# Patient Record
Sex: Female | Born: 1990 | Race: Black or African American | Hispanic: No | Marital: Married | Smoking: Former smoker
Health system: Southern US, Community
[De-identification: ages and names within clinical notes are randomized; demographics above are authoritative.]

## PROBLEM LIST (undated history)

## (undated) DIAGNOSIS — T7840XA Allergy, unspecified, initial encounter: Secondary | ICD-10-CM

## (undated) DIAGNOSIS — J45909 Unspecified asthma, uncomplicated: Secondary | ICD-10-CM

## (undated) DIAGNOSIS — J302 Other seasonal allergic rhinitis: Secondary | ICD-10-CM

## (undated) DIAGNOSIS — Z973 Presence of spectacles and contact lenses: Secondary | ICD-10-CM

## (undated) HISTORY — DX: Allergy, unspecified, initial encounter: T78.40XA

## (undated) HISTORY — DX: Unspecified asthma, uncomplicated: J45.909

## (undated) HISTORY — PX: ABDOMINAL HYSTERECTOMY: SHX81

---
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
## 2012-07-14 ENCOUNTER — Encounter: Payer: Self-pay | Admitting: Family

## 2012-07-14 ENCOUNTER — Ambulatory Visit (INDEPENDENT_AMBULATORY_CARE_PROVIDER_SITE_OTHER): Payer: Managed Care, Other (non HMO) | Admitting: Family

## 2012-07-14 VITALS — BP 116/78 | HR 114 | Ht 66.0 in | Wt 175.0 lb

## 2012-07-14 DIAGNOSIS — Z111 Encounter for screening for respiratory tuberculosis: Secondary | ICD-10-CM

## 2012-07-14 DIAGNOSIS — E559 Vitamin D deficiency, unspecified: Secondary | ICD-10-CM

## 2012-07-14 NOTE — Addendum Note (Signed)
Addended by: Beverely Low on: 07/14/2012 04:20 PM   Modules accepted: Orders

## 2012-07-14 NOTE — Patient Instructions (Addendum)
Vitamin D Deficiency   Not having enough vitamin D is called a deficiency. Your body needs this vitamin to keep your bones strong and healthy. Having too little of it can make your bones soft or can cause other health problems.   HOME CARE   Take all vitamins, herbs, or nutrition drinks (supplements) as told by your doctor.   Have your blood tested 2 months after taking vitamins, herbs, or nutrition drinks.   Eat foods that have vitamin D. This includes:   Dairy products, cereals, or juices with added vitamin D. Check the label.   Fatty fish like salmon or trout.   Eggs.   Oysters.   Go outside for 10 to 15 minutes when the sun is shining. Do this 3 times a week. Do not do this if you have skin cancer.   Do not use tanning beds.   Stay at a healthy weight. Lose weight if needed.   Keep all doctor visits as told.  GET HELP IF:   You have questions.   You continue to have problems.   You feel sick to your stomach (nauseous) or throw up (vomit).   You cannot go poop (constipated).   You feel confused.   You have severe belly (abdominal) or back pain.  MAKE SURE YOU:   Understand these instructions.   Will watch your condition.   Will get help right away if you are not doing well or get worse.  Document Released: 12/12/2010 Document Revised: 03/17/2011 Document Reviewed: 12/12/2010  ExitCare Patient Information 2014 [employer].

## 2012-07-14 NOTE — Progress Notes (Signed)
  Subjective:    Patient ID: Desiree Martinez, female    DOB: 1990-10-01, 22 y.o.   MRN: [id number]  HPI  22 year old [place] American female, nonsmoker, new take patient to the practice is in to be established. Denies any concerns. Reports a history of vitamin D deficiency in the past. But reports her vitamin D has been stable recently. She will be attending [employer] this fall, obtaining a Masters degree in accounting this fall. Therefore, she is requiring a PPD for school.  Review of Systems  Constitutional: Negative.   Respiratory: Negative.   Cardiovascular: Negative.   Gastrointestinal: Negative.   Allergic/Immunologic: Negative.   Neurological: Negative.   Hematological: Negative.   Psychiatric/Behavioral: Negative.    Past Medical History  Diagnosis Date  . Asthma     History   Social History  . Marital Status: Single    Spouse Name: N/A    Number of Children: N/A  . Years of Education: N/A   Occupational History  . Not on file.   Social History Main Topics  . Smoking status: Current Every Day Smoker    Types: Cigars  . Smokeless tobacco: Not on file  . Alcohol Use: Yes  . Drug Use: Yes    Special: Marijuana  . Sexually Active: Not on file   Other Topics Concern  . Not on file   Social History Narrative  . No narrative on file    History reviewed. No pertinent past surgical history.  No family history on file.  No Known Allergies  No current outpatient prescriptions on file prior to visit.   No current facility-administered medications on file prior to visit.    BP 116/78  Pulse 114  Ht 5\' 6"  (1.676 m)  Wt 175 lb (79.379 kg)  BMI 28.26 kg/m2  SpO2 98%  LMP 06/09/2014chart    Objective:   Physical Exam  Constitutional: She is oriented to person, place, and time. She appears well-developed and well-nourished.  Neck: Normal range of motion. Neck supple. No thyromegaly present.  Cardiovascular: Normal rate, regular rhythm and normal  heart sounds.   Pulmonary/Chest: Effort normal.  Abdominal: Soft.  Musculoskeletal: Normal range of motion.  Neurological: She is alert and oriented to person, place, and time.  Skin: Skin is dry.  Psychiatric: She has a normal mood and affect.          Assessment & Plan:  Assessment:  1. Vitamin D deficiency 2. Need for tuberculin skin test  Plan: Vitamin D sent. Return for complete physical exam. Return in 48-72 hours have PPD read. Call with any questions or concerns.

## 2012-07-19 LAB — VITAMIN D 1,25 DIHYDROXY
Vitamin D2 1, 25 (OH)2: 11 pg/mL
Vitamin D3 1, 25 (OH)2: 71 pg/mL

## 2013-01-21 ENCOUNTER — Ambulatory Visit (INDEPENDENT_AMBULATORY_CARE_PROVIDER_SITE_OTHER): Payer: Managed Care, Other (non HMO)

## 2013-01-21 VITALS — BP 125/81 | HR 99 | Resp 15 | Ht 65.0 in | Wt 189.0 lb

## 2013-01-21 DIAGNOSIS — M79609 Pain in unspecified limb: Secondary | ICD-10-CM

## 2013-01-21 DIAGNOSIS — M2010 Hallux valgus (acquired), unspecified foot: Secondary | ICD-10-CM

## 2013-01-21 DIAGNOSIS — M7750 Other enthesopathy of unspecified foot: Secondary | ICD-10-CM

## 2013-01-21 DIAGNOSIS — M7751 Other enthesopathy of right foot: Secondary | ICD-10-CM

## 2013-01-21 NOTE — Patient Instructions (Signed)
Pre-Operative Instructions  Congratulations, you have decided to take an important step to improving your quality of life.  You can be assured that the doctors of [facility] will be with you every step of the way.  1. Plan to be at the surgery center/hospital at least 1 (one) hour prior to your scheduled time unless otherwise directed by the surgical center/hospital staff.  You must have a responsible adult accompany you, remain during the surgery and drive you home.  Make sure you have directions to the surgical center/hospital and know how to get there on time. 2. For hospital based surgery you will need to obtain a history and physical form from your family physician within 1 month prior to the date of surgery- we will give you a form for you primary physician.  3. We make every effort to accommodate the date you request for surgery.  There are however, times where surgery dates or times have to be moved.  We will contact you as soon as possible if a change in schedule is required.   4. No Aspirin/Ibuprofen for one week before surgery.  If you are on aspirin, any non-steroidal anti-inflammatory medications (Mobic, Aleve, Ibuprofen) you should stop taking it 7 days prior to your surgery.  You make take Tylenol  For pain prior to surgery.  5. Medications- If you are taking daily heart and blood pressure medications, seizure, reflux, allergy, asthma, anxiety, pain or diabetes medications, make sure the surgery center/hospital is aware before the day of surgery so they may notify you which medications to take or avoid the day of surgery. 6. No food or drink after midnight the night before surgery unless directed otherwise by surgical center/hospital staff. 7. No alcoholic beverages 24 hours prior to surgery.  No smoking 24 hours prior to or 24 hours after surgery. 8. Wear loose pants or shorts- loose enough to fit over bandages, boots, and casts. 9. No slip on shoes, sneakers are best. 10. Bring  your boot with you to the surgery center/hospital.  Also bring crutches or a walker if your physician has prescribed it for you.  If you do not have this equipment, it will be provided for you after surgery. 11. If you have not been contracted by the surgery center/hospital by the day before your surgery, call to confirm the date and time of your surgery. 12. Leave-time from work may vary depending on the type of surgery you have.  Appropriate arrangements should be made prior to surgery with your employer. 13. Prescriptions will be provided immediately following surgery by your doctor.  Have these filled as soon as possible after surgery and take the medication as directed. 14. Remove nail polish on the operative foot. 15. Wash the night before surgery.  The night before surgery wash the foot and leg well with the antibacterial soap provided and water paying special attention to beneath the toenails and in between the toes.  Rinse thoroughly with water and dry well with a towel.  Perform this wash unless told not to do so by your physician.  Enclosed: 1 Ice pack (please put in freezer the night before surgery)   1 Hibiclens skin cleaner-can substitute any antibacterial liquid soap   Pre-op Instructions  If you have any questions regarding the instructions, do not hesitate to call our office.  [place]: [place], [place] [address] [place]: [address] [place], [place] [address] [phone]  [place]: [place], [place]  [address] [phone]  Dr. Kendell Bane DPM, Dr. Ila Mcgill DPM Dr. Harriet Masson DPM, Dr. Lanelle Bal DPM, Dr. Trudie Buckler DPM

## 2013-01-21 NOTE — Progress Notes (Signed)
   Subjective:    Patient ID: Desiree Martinez, female    DOB: 08/02/1990, 23 y.o.   MRN: [id number]  HPI N bunion        L R 1st MPJ        D 4 years        O off and on        C 1st toe abducts, sharp and can be a pulsating pain        A exercise, long period of walking, certain shoes        T none    Review of Systems  Allergic/Immunologic: Positive for food allergies.       Peanuts  All other systems reviewed and are negative.       Objective:   Physical Exam Vascular status is intact with pedal pulses palpable DP and PT +2/4 bilateral Refill timed 3-4 seconds all digits. Skin temperature warm turgor normal no edema rubor pallor or varicosities noted. Neurologically epicritic and proprioceptive sensations appear to be intact and symmetric bilateral there is normal plantar response DTRs not listed. Dermatologically skin color pigment normal hair growth absent. Nails normal trophic. No open wounds or ulcerations noted. Orthopedic biomechanical exam there is notable flexible digital contractures 2 through 5 otherwise rectus the hallux is laterally deviated bilateral more so on the right than left starting to push up and over on the second digit on both feet as well. X-rays demonstrate elevated I am angle approximately 13-14 on the right foot hallux abductus angle 30 with sesamoid position 6. There is some mild asymmetric joint space narrowing first MTP joint otherwise good clinical range of motion is noted the articular surface otherwise appears to be adequate. There is no crepitus on range of motion most of her pain associated with bony prominence and difficulty with certain: Shoes. 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 Would  like to schedule surgery at this time for June for proposed bunion correction. At this time consent form for [facility] with pin or screw fixation is reviewed and signed by the patient. We discussed the preop. Operative and postoperative course including risks palpitations and alternatives. Patient is dispensed literature about bunions surgery with additional details. Contact us if she has any additional questions in the interim.       Assessment & Plan:  Assessment this time hallux abductovalgus deformity bilateral feet right more so than left right being symptomatic and painful. Plan at this time did review consent form for [facility] and plan for potential surgical correction for great toe joint deformity right foot with pin or screw fixation in June of this year. Surgery done at decreased especially surgical center under IV sedation and local or regional anesthetic block. Patient will contact us is any additional questions in the interim. Surgery scheduled for June    Harriet Masson DPM

## 2013-06-06 DIAGNOSIS — M2010 Hallux valgus (acquired), unspecified foot: Secondary | ICD-10-CM

## 2013-06-06 HISTORY — PX: OTHER SURGICAL HISTORY: SHX169

## 2013-06-08 ENCOUNTER — Telehealth: Payer: Self-pay

## 2013-06-08 NOTE — Telephone Encounter (Signed)
Spoke with pt regarding post op status. She is managing her pain well, taking Rx pain medication scheduled. Advised to ice and elevate and remain in boot especially when ambulatory.

## 2013-06-14 ENCOUNTER — Ambulatory Visit (INDEPENDENT_AMBULATORY_CARE_PROVIDER_SITE_OTHER): Payer: Managed Care, Other (non HMO)

## 2013-06-14 VITALS — BP 113/72 | HR 76 | Resp 15 | Ht 65.5 in | Wt 188.0 lb

## 2013-06-14 DIAGNOSIS — M7750 Other enthesopathy of unspecified foot: Secondary | ICD-10-CM

## 2013-06-14 DIAGNOSIS — M21619 Bunion of unspecified foot: Secondary | ICD-10-CM

## 2013-06-14 DIAGNOSIS — M2010 Hallux valgus (acquired), unspecified foot: Secondary | ICD-10-CM

## 2013-06-14 DIAGNOSIS — Z09 Encounter for follow-up examination after completed treatment for conditions other than malignant neoplasm: Secondary | ICD-10-CM

## 2013-06-14 DIAGNOSIS — M7751 Other enthesopathy of right foot: Secondary | ICD-10-CM

## 2013-06-14 NOTE — Patient Instructions (Signed)

## 2013-06-14 NOTE — Progress Notes (Signed)
   Subjective:    Patient ID: Desiree Martinez, female    DOB: 1990-01-18, 23 y.o.   MRN: [id number]  HPI Comments: Pt states she feels she'd doing well.     Review of Systems no new findings or systemic changes noted     Objective:   Physical Exam Neurovascular status is intact pedal pulses are palpable epicritic and proprioceptive sensations intact dressings intact and dry no dehiscence no discharge good range of motion the hallux with proxy 4550 dorsiflexion 500 plantar flexion minimal or no pain. At this time presto compressive dressing was reapplied starting Sunday remove dressing resume normal bathing and hygiene and will continue with active passive shoe toe exercises maintain air fracture boot for 4 more weeks and ankle is dispensed to maintain compression during the day we've off at night wash and reapply every day. Patient will reappointed in 4 weeks for further followup and x-rays at that time to       Assessment & Plan:  Assessment postop good postop progress following Austin bunionectomy patient's alignment looks good first MTP area right instructions are given for place also range of motion exercises demonstrated the patient and maintain boot and fracture walker at all times may remove for exercises moving during the day and normal bathing and hygiene starting Sunday apply lotion to the incision area cocoa butter is recommended for the scar or middermal is also recommended followup in 4 weeks  Harriet Masson DPM

## 2013-07-15 ENCOUNTER — Ambulatory Visit (INDEPENDENT_AMBULATORY_CARE_PROVIDER_SITE_OTHER): Payer: Managed Care, Other (non HMO)

## 2013-07-15 VITALS — BP 107/71 | HR 86 | Resp 18

## 2013-07-15 DIAGNOSIS — M2010 Hallux valgus (acquired), unspecified foot: Secondary | ICD-10-CM

## 2013-07-15 DIAGNOSIS — M2011 Hallux valgus (acquired), right foot: Secondary | ICD-10-CM

## 2013-07-15 DIAGNOSIS — Z09 Encounter for follow-up examination after completed treatment for conditions other than malignant neoplasm: Secondary | ICD-10-CM

## 2013-07-15 NOTE — Progress Notes (Signed)
   Subjective:    Patient ID: Virgel Manifold, female    DOB: 07/23/1990, 23 y.o.   MRN: [id number]  HPI my right foot is doing good and surgery was 06/06/13    Review of Systems No new findings or systemic changes the    Objective:   Physical Exam Patient this time is approximately 6 weeks status post Austin bunionectomy right foot excellent dorsiflexion approximately 15 plantar flexion down to rectus our advised to continue with range of motion exercises as instructed specialty plantar flexion. Neurovascular status is intact pedal pulses palpable patient is advised to maintain a stable athletic or walking shoe crocs or Birkenstock sandals or twice daily is around the house. No barefoot shoes no flimsy shoes or flip-flops       Assessment & Plan:   Assessment good postop progress good consolidation the osteotomy intact fixation with no displacement hallux is relatively rectus position with good alignment no pain or discomfort minimal edema still present recheck in 2 months for long-term followup with x-ray contact if any changes occur stressed the importance of daily stretch and range of motion exercises to be continued  Harriet Masson DPM

## 2013-07-15 NOTE — Patient Instructions (Signed)
ICE INSTRUCTIONS  Apply ice or cold pack to the affected area at least 3 times a day for 10-15 minutes each time.  You should also use ice after prolonged activity or vigorous exercise.  Do not apply ice longer than 20 minutes at one time.  Always keep a cloth between your skin and the ice pack to prevent burns.  Being consistent and following these instructions will help control your symptoms.  We suggest you purchase a gel ice pack because they are reusable and do bit leak.  Some of them are designed to wrap around the area.  Use the method that works best for you.  Here are some other suggestions for icing.   Use a frozen bag of peas or corn-inexpensive and molds well to your body, usually stays frozen for 10 to 20 minutes.  Wet a towel with cold water and squeeze out the excess until it's damp.  Place in a bag in the freezer for 20 minutes. Then remove and use.  Also continue to do range of motion exercising moving the great toe up and down. Do Lisa 100 toe pushup simple times a day.  Discontinue boot and resume using a couple walking tennis or athletic shoe firm stable shoes no flip-flops or flimsy shoes. No barefoot walking

## 2015-10-03 ENCOUNTER — Other Ambulatory Visit (HOSPITAL_COMMUNITY)
Admission: RE | Admit: 2015-10-03 | Discharge: 2015-10-03 | Disposition: A | Discharge status: Home / Self Care | Payer: 59 | Source: Ambulatory Visit | Attending: Obstetrics and Gynecology | Admitting: Obstetrics and Gynecology

## 2015-10-03 ENCOUNTER — Other Ambulatory Visit: Payer: Self-pay | Admitting: Obstetrics and Gynecology

## 2015-10-03 DIAGNOSIS — Z01419 Encounter for gynecological examination (general) (routine) without abnormal findings: Secondary | ICD-10-CM | POA: Insufficient documentation

## 2015-10-04 LAB — CYTOLOGY - PAP

## 2016-05-16 ENCOUNTER — Encounter (HOSPITAL_COMMUNITY): Payer: Self-pay

## 2016-05-16 ENCOUNTER — Emergency Department (HOSPITAL_COMMUNITY)
Admission: EM | Admit: 2016-05-16 | Discharge: 2016-05-17 | Disposition: A | Discharge status: Home / Self Care | Payer: 59 | Attending: Emergency Medicine | Admitting: Emergency Medicine

## 2016-05-16 DIAGNOSIS — L03011 Cellulitis of right finger: Secondary | ICD-10-CM | POA: Diagnosis not present

## 2016-05-16 DIAGNOSIS — Z91010 Allergy to peanuts: Secondary | ICD-10-CM | POA: Diagnosis not present

## 2016-05-16 DIAGNOSIS — M79644 Pain in right finger(s): Secondary | ICD-10-CM | POA: Diagnosis present

## 2016-05-16 DIAGNOSIS — F17290 Nicotine dependence, other tobacco product, uncomplicated: Secondary | ICD-10-CM | POA: Insufficient documentation

## 2016-05-16 DIAGNOSIS — J45909 Unspecified asthma, uncomplicated: Secondary | ICD-10-CM | POA: Diagnosis not present

## 2016-05-16 NOTE — ED Triage Notes (Signed)
Pt c/o of R middle finger pain and swelling. She denies injury and states that the pain gets worse at night. Finger noticeably swollen. A&Ox4. Ambulatory.

## 2016-05-17 ENCOUNTER — Emergency Department (HOSPITAL_COMMUNITY): Payer: 59

## 2016-05-17 MED ORDER — CEPHALEXIN 500 MG PO CAPS: 500.0000 mg | ORAL_CAPSULE | Freq: Four times a day (QID) | ORAL | 0 refills | Status: DC

## 2016-05-17 MED ORDER — LIDOCAINE HCL 2 % IJ SOLN
10.0000 mL | Freq: Once | INTRAMUSCULAR | Status: AC
  Administered 2016-05-17: 200 mg
  Filled 2016-05-17: qty 20

## 2016-05-17 NOTE — ED Provider Notes (Signed)
[facility] DEPT Provider Note   CSN: [id number] Arrival date & time: 05/16/16  2318     History   Chief Complaint Chief Complaint  Patient presents with  . Hand Pain    R Middle finger    HPI Desiree Martinez is a 26 y.o. female.  The history is provided by the patient and medical records. No language interpreter was used.   Desiree Martinez is a left-hand dominant 26 y.o. female  with a PMH of asthma who presents to the Emergency Department complaining of progressively worsening right middle distal finger pain and swelling x 5 days. Worse with palpation and movement. No known injury / trauma. No drainage. No medications taken prior to arrival for symptoms. No fevers.   Past Medical History:  Diagnosis Date  . Asthma     Patient Active Problem List   Diagnosis Date Noted  . Unspecified vitamin D deficiency 07/14/2012    History reviewed. No pertinent surgical history.  OB History    No data available       Home Medications    Prior to Admission medications   Medication Sig Start Date End Date Taking? Authorizing Provider  OXYCODONE HCL PO Take by mouth.    [provider]    Family History History reviewed. No pertinent family history.  Social History Social History  Substance Use Topics  . Smoking status: Current Every Day Smoker    Types: Cigars  . Smokeless tobacco: Not on file  . Alcohol use Yes     Allergies   Peanuts [peanut oil]   Review of Systems Review of Systems  Constitutional: Negative for fever.  Musculoskeletal: Positive for arthralgias and joint swelling.  Skin: Negative for color change.     Physical Exam Updated Vital Signs BP 121/89 (BP Location: Right Arm)   Pulse (!) 107   Temp 98.9 F (37.2 C) (Oral)   Resp 18   LMP 05/01/2016   SpO2 100%   Physical Exam  Constitutional: She is oriented to person, place, and time. She appears well-developed and well-nourished. No distress.  HENT:  Head:  Normocephalic and atraumatic.  Cardiovascular: Normal rate, regular rhythm and normal heart sounds.   No murmur heard. Pulmonary/Chest: Effort normal and breath sounds normal. No respiratory distress.  Abdominal: Soft. She exhibits no distension. There is no tenderness.  Musculoskeletal:  Right middle finger with area of fluctuance to lateral nail fold c/w paronychia. She does also have tenderness and swelling to finger pad as well. No active drainage noted. No surrounding erythema. 2+ radial pulse. Sensation intact. Full ROM.  Neurological: She is alert and oriented to person, place, and time.  Skin: Skin is warm and dry. Capillary refill takes less than 2 seconds.  Nursing note and vitals reviewed.   ED Treatments / Results  Labs (all labs ordered are listed, but only abnormal results are displayed) Labs Reviewed - No data to display  EKG  EKG Interpretation None       Radiology Dg Finger Middle Right  Result Date: 05/17/2016 CLINICAL DATA:  Right middle finger pain and swelling for 2 days. No known injury. EXAM: RIGHT MIDDLE FINGER 2+V COMPARISON:  None. FINDINGS: There is cortical irregularity involving the volar cortex of the distal tuft. No periosteal reaction. Remainder the digit is intact. The alignment and joint spaces are maintained. Mild soft tissue edema, no soft tissue air or radiopaque foreign body per IMPRESSION: Cortical irregularity about the volar cortex of the distal tuft. Findings  are suspicious for nondisplaced fracture. Given no known injury, infection could produce a similar appearance. Electronically Signed   By: Jeb Levering M.D.   On: 05/17/2016 00:20    Procedures Procedures (including critical care time)  NERVE BLOCK Performed by: Ozella Almond Hager Compston Consent: Verbal consent obtained. Required items: required blood products, implants, devices, and special equipment available Time out: Immediately prior to procedure a "time out" was called to verify  the correct patient, procedure, equipment, support staff and site/side marked as required. Indication: Pain relief Nerve block body site:  Digital  Preparation: Patient was prepped and draped in the usual sterile fashion. Needle gauge: 25 G Location technique: anatomical landmarks Local anesthetic: 2% lidocaine without epi Anesthetic total: 6 ml Outcome: pain improved Patient tolerance: Patient tolerated the procedure well with no immediate complications.  INCISION AND DRAINAGE Performed by: Ozella Almond Marlana Mckowen Consent: Verbal consent obtained. Risks and benefits: risks, benefits and alternatives were discussed Time out performed prior to procedure Type: abscess Body area: Paronychia right middle finger Anesthesia: Digital block as noted above Incision was made with a scalpel. Complexity: simple Drainage: purulent Drainage amount: moderate Patient tolerance: Patient tolerated the procedure well with no immediate complications.   Medications Ordered in ED Medications - No data to display   Initial Impression / Assessment and Plan / ED Course  I have reviewed the triage vital signs and the nursing notes.  Pertinent labs & imaging results that were available during my care of the patient were reviewed by me and considered in my medical decision making (see chart for details).    Desiree Martinez is a 26 y.o. female who presents to ED for right distal middle finger pain and swelling. Exam c/w paronychia, however she does have tenderness / swelling to palpation of the pad of the finger as well. X-ray obtained in triage shows ? Distal fracture vs. Infection. Hand surgery consulted who does not believe that x-ray and history is c/w osteo. Recommends opening paronychia and if still concerned for felon, will need to I&D felon as well. Will need ABX and wound check in 2 days.   Digital block performed and paronychia drained. Swelling much improved. Swelling to the finger pad much better.  Patient also evaluated by attending, Dr. Sherry Ruffing, following I&D. Believe swelling was due to paronychia and no further I&D needs to be performed, however did speak to patient at length about low threshold to return if symptoms worsen or she is not improving. Wound check in 2 days. Rx for Keflex given. All questions answered.   Patient seen by and discussed with Dr. Sherry Ruffing who agrees with treatment plan.    Final Clinical Impressions(s) / ED Diagnoses   Final diagnoses:  None    New Prescriptions New Prescriptions   No medications on file     Goro Wenrick, Ozella Almond, PA-C 05/17/16 1314    Tegeler, Gwenyth Allegra, MD 05/17/16 1234

## 2016-05-17 NOTE — Discharge Instructions (Signed)
It was my pleasure taking care of you today!   Please take all of your antibiotics until finished!   Ibuprofen as needed for pain.   Perform warm water soaks for 5-10 mins in half water, half hydrogen peroxide, twice daily x 5-7 days. Please return to the emergency department for wound check in 1-2 days if no improvement. Watch for increasing pain, swelling, drainage/pus, or fever, and return to the emergency department immediately if any of these symptoms occur.

## 2016-05-21 ENCOUNTER — Encounter (HOSPITAL_COMMUNITY): Payer: Self-pay | Admitting: Family Medicine

## 2016-05-21 ENCOUNTER — Ambulatory Visit (INDEPENDENT_AMBULATORY_CARE_PROVIDER_SITE_OTHER): Payer: 59

## 2016-05-21 ENCOUNTER — Ambulatory Visit (HOSPITAL_COMMUNITY)
Admission: EM | Admit: 2016-05-21 | Discharge: 2016-05-21 | Disposition: A | Discharge status: Home / Self Care | Payer: 59 | Attending: Family Medicine | Admitting: Family Medicine

## 2016-05-21 DIAGNOSIS — N390 Urinary tract infection, site not specified: Secondary | ICD-10-CM | POA: Diagnosis not present

## 2016-05-21 DIAGNOSIS — R8281 Pyuria: Secondary | ICD-10-CM

## 2016-05-21 DIAGNOSIS — F17290 Nicotine dependence, other tobacco product, uncomplicated: Secondary | ICD-10-CM | POA: Insufficient documentation

## 2016-05-21 DIAGNOSIS — N83209 Unspecified ovarian cyst, unspecified side: Secondary | ICD-10-CM

## 2016-05-21 DIAGNOSIS — E559 Vitamin D deficiency, unspecified: Secondary | ICD-10-CM | POA: Insufficient documentation

## 2016-05-21 DIAGNOSIS — R1010 Upper abdominal pain, unspecified: Secondary | ICD-10-CM

## 2016-05-21 DIAGNOSIS — Z79899 Other long term (current) drug therapy: Secondary | ICD-10-CM | POA: Diagnosis not present

## 2016-05-21 DIAGNOSIS — Z3202 Encounter for pregnancy test, result negative: Secondary | ICD-10-CM | POA: Diagnosis not present

## 2016-05-21 DIAGNOSIS — J45909 Unspecified asthma, uncomplicated: Secondary | ICD-10-CM | POA: Diagnosis not present

## 2016-05-21 DIAGNOSIS — Z888 Allergy status to other drugs, medicaments and biological substances status: Secondary | ICD-10-CM | POA: Insufficient documentation

## 2016-05-21 LAB — POCT URINALYSIS DIP (DEVICE)
BILIRUBIN URINE: NEGATIVE
GLUCOSE, UA: NEGATIVE mg/dL
Hgb urine dipstick: NEGATIVE
KETONES UR: NEGATIVE mg/dL
Nitrite: NEGATIVE
PROTEIN: NEGATIVE mg/dL
SPECIFIC GRAVITY, URINE: 1.025 (ref 1.005–1.030)
Urobilinogen, UA: 0.2 mg/dL (ref 0.0–1.0)
pH: 5.5 (ref 5.0–8.0)

## 2016-05-21 LAB — POCT I-STAT, CHEM 8
BUN: 18 mg/dL (ref 6–20)
CALCIUM ION: 1.21 mmol/L (ref 1.15–1.40)
CHLORIDE: 107 mmol/L (ref 101–111)
Creatinine, Ser: 0.7 mg/dL (ref 0.44–1.00)
Glucose, Bld: 97 mg/dL (ref 65–99)
HCT: 36 % (ref 36.0–46.0)
Hemoglobin: 12.2 g/dL (ref 12.0–15.0)
POTASSIUM: 4 mmol/L (ref 3.5–5.1)
SODIUM: 140 mmol/L (ref 135–145)
TCO2: 25 mmol/L (ref 0–100)

## 2016-05-21 LAB — POCT PREGNANCY, URINE: Preg Test, Ur: NEGATIVE

## 2016-05-21 MED ORDER — HYOSCYAMINE SULFATE SL 0.125 MG SL SUBL: 1.0000 | SUBLINGUAL_TABLET | Freq: Three times a day (TID) | SUBLINGUAL | 1 refills | Status: DC | PRN

## 2016-05-21 NOTE — ED Triage Notes (Signed)
Pt here for lower abd pain that started 45 minutes ago. Denies any N.V.D. Denies any urinary symptoms.

## 2016-05-21 NOTE — ED Provider Notes (Signed)
[place]    CSN: [id number] Arrival date & time: 05/21/16  1012     History   Chief Complaint Chief Complaint  Patient presents with  . Abdominal Pain    HPI Desiree Martinez is a 26 y.o. female.   Is a 26 year old gay woman who presents with upper abdominal pain about one hour duration. She has some pain about 5 days ago which resolved by itself.  Patient describes pain as sharp and worse in the right upper quadrant. She's had nothing to eat today other than a glass of water.  Patient was seen for a paronychia last Friday and on Saturday started taking cephalexin. She is known to have an ovarian cyst and is supposed to have a gynecological follow-up. Her last menstrual period was 2 weeks ago.  No nausea, vomiting, fever, or constipation.      Past Medical History:  Diagnosis Date  . Asthma     Patient Active Problem List   Diagnosis Date Noted  . Unspecified vitamin D deficiency 07/14/2012    History reviewed. No pertinent surgical history.  OB History    No data available       Home Medications    Prior to Admission medications   Medication Sig Start Date End Date Taking? Authorizing Provider  cephALEXin (KEFLEX) 500 MG capsule Take 500 mg by mouth 4 (four) times daily.   Yes [provider]  Hyoscyamine Sulfate SL (LEVSIN/SL) 0.125 MG SUBL Place 1 tablet under the tongue every 8 (eight) hours as needed. 05/21/16   Robyn Haber, MD    Family History History reviewed. No pertinent family history.  Social History Social History  Substance Use Topics  . Smoking status: Current Every Day Smoker    Types: Cigars  . Smokeless tobacco: Never Used  . Alcohol use Yes     Allergies   Peanuts [peanut oil]   Review of Systems Review of Systems  Constitutional: Positive for activity change.  HENT: Negative.   Respiratory: Negative.   Gastrointestinal: Positive for abdominal pain. Negative for blood in stool, constipation,  diarrhea, nausea, rectal pain and vomiting.  Genitourinary: Negative.   Neurological: Negative.   All other systems reviewed and are negative.    Physical Exam Triage Vital Signs ED Triage Vitals  Enc Vitals Group     BP 05/21/16 1102 102/68     Pulse Rate 05/21/16 1102 84     Resp 05/21/16 1102 18     Temp 05/21/16 1102 98.2 F (36.8 C)     Temp src --      SpO2 05/21/16 1102 96 %     Weight --      Height --      Head Circumference --      Peak Flow --      Pain Score 05/21/16 1100 4     Pain Loc --      Pain Edu? --      Excl. in [place]? --    No data found.   Updated Vital Signs BP 102/68   Pulse 84   Temp 98.2 F (36.8 C)   Resp 18   LMP 05/01/2016   SpO2 96%    Physical Exam  Constitutional: She is oriented to person, place, and time. She appears well-developed and well-nourished.  HENT:  Right Ear: External ear normal.  Left Ear: External ear normal.  Mouth/Throat: Oropharynx is clear and moist.  Eyes: Conjunctivae and EOM are normal. Pupils are equal,  round, and reactive to light.  Neck: Normal range of motion. Neck supple.  Cardiovascular: Normal rate.   Pulmonary/Chest: Effort normal.  Abdominal: Soft. She exhibits no distension and no mass. There is tenderness. There is no rebound and no guarding.  Tenderness in abdomen is in the right upper quadrant primarily although there is some tenderness with deep palpation in the left upper quadrant and both lower quadrants.  Musculoskeletal: Normal range of motion.  Neurological: She is alert and oriented to person, place, and time.  Skin: Skin is warm and dry.  Nursing note and vitals reviewed.    UC Treatments / Results  Labs (all labs ordered are listed, but only abnormal results are displayed) Labs Reviewed  POCT URINALYSIS DIP (DEVICE) - Abnormal; Notable for the following:       Result Value   Leukocytes, UA TRACE (*)    All other components within normal limits  URINE CULTURE  POCT PREGNANCY,  URINE  POCT I-STAT, CHEM 8    EKG  EKG Interpretation None       Radiology Dg Abd 1 View  Result Date: 05/21/2016 CLINICAL DATA:  Upper abdominal pain. EXAM: ABDOMEN - 1 VIEW COMPARISON:  4/4/8 FINDINGS: The bowel gas pattern is normal. No radio-opaque calculi or other significant radiographic abnormality are seen. IMPRESSION: Negative. Electronically Signed   By: Kerby Moors M.D.   On: 05/21/2016 11:54    Procedures Procedures (including critical care time)  Medications Ordered in UC Medications - No data to display   Initial Impression / Assessment and Plan / UC Course  I have reviewed the triage vital signs and the nursing notes.  Pertinent labs & imaging results that were available during my care of the patient were reviewed by me and considered in my medical decision making (see chart for details).   Final Clinical Impressions(s) / UC Diagnoses   Final diagnoses:  Pain of upper abdomen  Cyst of ovary, unspecified laterality  Pyuria    New Prescriptions New Prescriptions   HYOSCYAMINE SULFATE SL (LEVSIN/SL) 0.125 MG SUBL    Place 1 tablet under the tongue every 8 (eight) hours as needed.     Robyn Haber, MD 05/21/16 1208

## 2016-05-21 NOTE — Discharge Instructions (Signed)
The tests reveal no acute abnormalities.  Keep your appointment tomorrow.  If symptoms persist or worsen, go to the emergency department for other imaging studies (ultrasound or CT scan)

## 2016-05-22 LAB — URINE CULTURE: Culture: <10000 — AB

## 2016-06-20 NOTE — Patient Instructions (Addendum)
Your procedure is scheduled on: Wednesday, June 27  Enter through the [employer] [facility] at:  6 am  Pick up the phone at the desk and dial [phone].  Call this number if you have problems the morning of surgery: [phone].  Remember: Do NOT eat or drink (including water) after midnight Tuesday.   Take these medicines the morning of surgery with a SIP OF WATER:  None  Do Not smoke on the day of surgery.  Stop herbal medications and supplements at this time.  Do NOT wear jewelry (body piercing), metal hair clips/bobby pins, make-up, or nail polish. Do NOT wear lotions, powders, or perfumes.  You may wear deoderant. Do NOT shave for 48 hours prior to surgery. Do NOT bring valuables to the hospital.   Have a responsible adult drive you home and stay with you for 24 hours after your procedure.  Home with girlfriend Cristen cell [phone].

## 2016-06-23 ENCOUNTER — Encounter (HOSPITAL_COMMUNITY): Payer: Self-pay

## 2016-06-23 ENCOUNTER — Encounter (HOSPITAL_COMMUNITY)
Admission: RE | Admit: 2016-06-23 | Discharge: 2016-06-23 | Disposition: A | Discharge status: Home / Self Care | Payer: 59 | Source: Ambulatory Visit | Attending: Obstetrics and Gynecology | Admitting: Obstetrics and Gynecology

## 2016-06-23 DIAGNOSIS — Z01818 Encounter for other preprocedural examination: Secondary | ICD-10-CM | POA: Diagnosis not present

## 2016-06-23 HISTORY — DX: Other seasonal allergic rhinitis: J30.2

## 2016-06-23 LAB — CBC
HEMATOCRIT: 37.9 % (ref 36.0–46.0)
HEMOGLOBIN: 12.2 g/dL (ref 12.0–15.0)
MCH: 27.9 pg (ref 26.0–34.0)
MCHC: 32.2 g/dL (ref 30.0–36.0)
MCV: 86.7 fL (ref 78.0–100.0)
Platelets: 243 10*3/uL (ref 150–400)
RBC: 4.37 MIL/uL (ref 3.87–5.11)
RDW: 16 % — AB (ref 11.5–15.5)
WBC: 7.5 10*3/uL (ref 4.0–10.5)

## 2016-07-02 ENCOUNTER — Ambulatory Visit (HOSPITAL_COMMUNITY): Payer: 59 | Admitting: Anesthesiology

## 2016-07-02 ENCOUNTER — Encounter (HOSPITAL_COMMUNITY)
Admission: RE | Disposition: A | Discharge status: Home / Self Care | Payer: Self-pay | Source: Ambulatory Visit | Attending: Obstetrics and Gynecology

## 2016-07-02 ENCOUNTER — Ambulatory Visit (HOSPITAL_COMMUNITY)
Admission: RE | Admit: 2016-07-02 | Discharge: 2016-07-02 | Disposition: A | Discharge status: Home / Self Care | Payer: 59 | Source: Ambulatory Visit | Attending: Obstetrics and Gynecology | Admitting: Obstetrics and Gynecology

## 2016-07-02 ENCOUNTER — Encounter (HOSPITAL_COMMUNITY): Payer: Self-pay | Admitting: *Deleted

## 2016-07-02 DIAGNOSIS — N801 Endometriosis of ovary: Secondary | ICD-10-CM | POA: Diagnosis not present

## 2016-07-02 DIAGNOSIS — R102 Pelvic and perineal pain: Secondary | ICD-10-CM | POA: Insufficient documentation

## 2016-07-02 DIAGNOSIS — N83202 Unspecified ovarian cyst, left side: Secondary | ICD-10-CM | POA: Diagnosis not present

## 2016-07-02 DIAGNOSIS — N736 Female pelvic peritoneal adhesions (postinfective): Secondary | ICD-10-CM | POA: Insufficient documentation

## 2016-07-02 HISTORY — PX: LAPAROSCOPIC OVARIAN CYSTECTOMY: SHX6248

## 2016-07-02 LAB — PREGNANCY, URINE: Preg Test, Ur: NEGATIVE

## 2016-07-02 SURGERY — EXCISION, CYST, OVARY, LAPAROSCOPIC
Anesthesia: General | Site: Abdomen | Laterality: Left

## 2016-07-02 MED ORDER — ONDANSETRON HCL 4 MG/2ML IJ SOLN
INTRAMUSCULAR | Status: AC
  Filled 2016-07-02: qty 2

## 2016-07-02 MED ORDER — MEPERIDINE HCL 25 MG/ML IJ SOLN: 6.2500 mg | INTRAMUSCULAR | Status: DC | PRN

## 2016-07-02 MED ORDER — OXYCODONE HCL 5 MG PO TABS: 5.0000 mg | ORAL_TABLET | Freq: Once | ORAL | Status: DC | PRN

## 2016-07-02 MED ORDER — FENTANYL CITRATE (PF) 250 MCG/5ML IJ SOLN
INTRAMUSCULAR | Status: AC
  Filled 2016-07-02: qty 5

## 2016-07-02 MED ORDER — PROMETHAZINE HCL 25 MG/ML IJ SOLN: 6.2500 mg | INTRAMUSCULAR | Status: DC | PRN

## 2016-07-02 MED ORDER — ROCURONIUM BROMIDE 100 MG/10ML IV SOLN
INTRAVENOUS | Status: DC | PRN
  Administered 2016-07-02 (×2): 10 mg via INTRAVENOUS
  Administered 2016-07-02: 50 mg via INTRAVENOUS

## 2016-07-02 MED ORDER — LIDOCAINE HCL (CARDIAC) 20 MG/ML IV SOLN
INTRAVENOUS | Status: DC | PRN
  Administered 2016-07-02: 50 mg via INTRAVENOUS

## 2016-07-02 MED ORDER — CEFAZOLIN SODIUM-DEXTROSE 2-4 GM/100ML-% IV SOLN
2.0000 g | INTRAVENOUS | Status: AC
  Administered 2016-07-02: 2 g via INTRAVENOUS

## 2016-07-02 MED ORDER — HYDROMORPHONE HCL 1 MG/ML IJ SOLN
0.2500 mg | INTRAMUSCULAR | Status: DC | PRN
  Administered 2016-07-02 (×2): 0.25 mg via INTRAVENOUS

## 2016-07-02 MED ORDER — ONDANSETRON HCL 4 MG/2ML IJ SOLN
INTRAMUSCULAR | Status: AC
Start: 2016-07-02 — End: 2016-07-02
  Administered 2016-07-02: 4 mg via INTRAVENOUS
  Filled 2016-07-02: qty 2

## 2016-07-02 MED ORDER — SCOPOLAMINE 1 MG/3DAYS TD PT72
1.0000 | MEDICATED_PATCH | Freq: Once | TRANSDERMAL | Status: DC
  Administered 2016-07-02: 1.5 mg via TRANSDERMAL

## 2016-07-02 MED ORDER — PROPOFOL 10 MG/ML IV BOLUS
INTRAVENOUS | Status: DC | PRN
  Administered 2016-07-02: 200 mg via INTRAVENOUS

## 2016-07-02 MED ORDER — LIDOCAINE HCL (CARDIAC) 20 MG/ML IV SOLN
INTRAVENOUS | Status: AC
  Filled 2016-07-02: qty 5

## 2016-07-02 MED ORDER — MIDAZOLAM HCL 2 MG/2ML IJ SOLN
INTRAMUSCULAR | Status: DC | PRN
  Administered 2016-07-02: 2 mg via INTRAVENOUS

## 2016-07-02 MED ORDER — MIDAZOLAM HCL 2 MG/2ML IJ SOLN
INTRAMUSCULAR | Status: AC
  Filled 2016-07-02: qty 2

## 2016-07-02 MED ORDER — SUGAMMADEX SODIUM 200 MG/2ML IV SOLN
INTRAVENOUS | Status: DC | PRN
  Administered 2016-07-02: 200 mg via INTRAVENOUS

## 2016-07-02 MED ORDER — GLYCOPYRROLATE 0.2 MG/ML IJ SOLN
INTRAMUSCULAR | Status: AC
  Filled 2016-07-02: qty 1

## 2016-07-02 MED ORDER — BUPIVACAINE HCL (PF) 0.25 % IJ SOLN
INTRAMUSCULAR | Status: DC | PRN
  Administered 2016-07-02: 13 mL
  Administered 2016-07-02: 17 mL

## 2016-07-02 MED ORDER — HYDROMORPHONE HCL 1 MG/ML IJ SOLN
INTRAMUSCULAR | Status: AC
  Administered 2016-07-02: 0.25 mg via INTRAVENOUS
  Filled 2016-07-02: qty 0.5

## 2016-07-02 MED ORDER — HYDROMORPHONE HCL 1 MG/ML IJ SOLN
INTRAMUSCULAR | Status: DC | PRN
  Administered 2016-07-02 (×2): 0.5 mg via INTRAVENOUS

## 2016-07-02 MED ORDER — PROPOFOL 10 MG/ML IV BOLUS
INTRAVENOUS | Status: AC
  Filled 2016-07-02: qty 20

## 2016-07-02 MED ORDER — LACTATED RINGERS IV SOLN
INTRAVENOUS | Status: DC
  Administered 2016-07-02 (×3): via INTRAVENOUS

## 2016-07-02 MED ORDER — SCOPOLAMINE 1 MG/3DAYS TD PT72
MEDICATED_PATCH | TRANSDERMAL | Status: AC
  Administered 2016-07-02: 1.5 mg via TRANSDERMAL
  Filled 2016-07-02: qty 1

## 2016-07-02 MED ORDER — GLYCOPYRROLATE 0.2 MG/ML IJ SOLN
INTRAMUSCULAR | Status: DC | PRN
  Administered 2016-07-02: 0.2 mg via INTRAVENOUS

## 2016-07-02 MED ORDER — HYDROMORPHONE HCL 1 MG/ML IJ SOLN
INTRAMUSCULAR | Status: AC
  Filled 2016-07-02: qty 1

## 2016-07-02 MED ORDER — DEXAMETHASONE SODIUM PHOSPHATE 10 MG/ML IJ SOLN
INTRAMUSCULAR | Status: AC
  Filled 2016-07-02: qty 1

## 2016-07-02 MED ORDER — FENTANYL CITRATE (PF) 100 MCG/2ML IJ SOLN
INTRAMUSCULAR | Status: DC | PRN
  Administered 2016-07-02 (×3): 50 ug via INTRAVENOUS
  Administered 2016-07-02: 100 ug via INTRAVENOUS
  Administered 2016-07-02 (×2): 50 ug via INTRAVENOUS

## 2016-07-02 MED ORDER — LACTATED RINGERS IR SOLN
Status: DC | PRN
  Administered 2016-07-02 (×2): 3000 mL

## 2016-07-02 MED ORDER — OXYCODONE HCL 5 MG/5ML PO SOLN: 5.0000 mg | Freq: Once | ORAL | Status: DC | PRN

## 2016-07-02 MED ORDER — FENTANYL CITRATE (PF) 100 MCG/2ML IJ SOLN
INTRAMUSCULAR | Status: AC
  Filled 2016-07-02: qty 2

## 2016-07-02 MED ORDER — ONDANSETRON HCL 4 MG/2ML IJ SOLN
INTRAMUSCULAR | Status: DC | PRN
Start: 2016-07-02 — End: 2016-07-02
  Administered 2016-07-02: 4 mg via INTRAVENOUS

## 2016-07-02 MED ORDER — IBUPROFEN 800 MG PO TABS: 800.0000 mg | ORAL_TABLET | Freq: Three times a day (TID) | ORAL | 0 refills | Status: DC | PRN

## 2016-07-02 MED ORDER — SUGAMMADEX SODIUM 200 MG/2ML IV SOLN
INTRAVENOUS | Status: AC
  Filled 2016-07-02: qty 2

## 2016-07-02 MED ORDER — ONDANSETRON HCL 4 MG/2ML IJ SOLN
4.0000 mg | Freq: Once | INTRAMUSCULAR | Status: AC
  Administered 2016-07-02: 4 mg via INTRAVENOUS

## 2016-07-02 MED ORDER — ROCURONIUM BROMIDE 100 MG/10ML IV SOLN
INTRAVENOUS | Status: AC
  Filled 2016-07-02: qty 1

## 2016-07-02 MED ORDER — OXYCODONE-ACETAMINOPHEN 5-325 MG PO TABS: 1.0000 | ORAL_TABLET | ORAL | 0 refills | Status: DC | PRN

## 2016-07-02 MED ORDER — DEXAMETHASONE SODIUM PHOSPHATE 10 MG/ML IJ SOLN
INTRAMUSCULAR | Status: DC | PRN
  Administered 2016-07-02: 4 mg via INTRAVENOUS

## 2016-07-02 MED ORDER — BUPIVACAINE HCL (PF) 0.25 % IJ SOLN
INTRAMUSCULAR | Status: AC
  Filled 2016-07-02: qty 30

## 2016-07-02 SURGICAL SUPPLY — 26 items
APPLICATOR ARISTA FLEXITIP XL (MISCELLANEOUS) ×2 IMPLANT
CLOTH BEACON ORANGE TIMEOUT ST (SAFETY) ×2 IMPLANT
DERMABOND ADVANCED (GAUZE/BANDAGES/DRESSINGS) ×1
DERMABOND ADVANCED .7 DNX12 (GAUZE/BANDAGES/DRESSINGS) ×1 IMPLANT
DRSG OPSITE POSTOP 3X4 (GAUZE/BANDAGES/DRESSINGS) ×2 IMPLANT
DURAPREP 26ML APPLICATOR (WOUND CARE) ×2 IMPLANT
GLOVE BIO SURGEON STRL SZ7 (GLOVE) ×2 IMPLANT
GLOVE BIOGEL PI IND STRL 7.0 (GLOVE) ×4 IMPLANT
GLOVE BIOGEL PI INDICATOR 7.0 (GLOVE) ×4
GOWN STRL REUS W/TWL LRG LVL3 (GOWN DISPOSABLE) ×4 IMPLANT
HEMOSTAT ARISTA ABSORB 3G PWDR (MISCELLANEOUS) ×4 IMPLANT
PACK LAPAROSCOPY BASIN (CUSTOM PROCEDURE TRAY) ×2 IMPLANT
PACK TRENDGUARD 450 HYBRID PRO (MISCELLANEOUS) ×1 IMPLANT
POUCH SPECIMEN RETRIEVAL 10MM (ENDOMECHANICALS) ×2 IMPLANT
PROTECTOR NERVE ULNAR (MISCELLANEOUS) ×4 IMPLANT
SET IRRIG TUBING LAPAROSCOPIC (IRRIGATION / IRRIGATOR) ×2 IMPLANT
SHEARS HARMONIC ACE PLUS 36CM (ENDOMECHANICALS) ×2 IMPLANT
SLEEVE XCEL OPT CAN 5 100 (ENDOMECHANICALS) ×2 IMPLANT
SOLUTION ELECTROLUBE (MISCELLANEOUS) ×2 IMPLANT
SUT MNCRL AB 4-0 PS2 18 (SUTURE) ×2 IMPLANT
SUT VICRYL 0 UR6 27IN ABS (SUTURE) ×2 IMPLANT
TOWEL OR 17X24 6PK STRL BLUE (TOWEL DISPOSABLE) ×4 IMPLANT
TRAY FOLEY CATH SILVER 14FR (SET/KITS/TRAYS/PACK) ×2 IMPLANT
TRENDGUARD 450 HYBRID PRO PACK (MISCELLANEOUS) ×2
TROCAR XCEL NON-BLD 11X100MML (ENDOMECHANICALS) ×2 IMPLANT
TROCAR XCEL NON-BLD 5MMX100MML (ENDOMECHANICALS) ×2 IMPLANT

## 2016-07-02 NOTE — Anesthesia Postprocedure Evaluation (Signed)
Anesthesia Post Note  Patient: Desiree Martinez  Procedure(s) Performed: Procedure(s) (LRB): LAPAROSCOPIC OVARIAN CYSTECTOMY, Lysis of Adhesions (Left)     Patient location during evaluation: PACU Anesthesia Type: General Level of consciousness: awake and alert Pain management: pain level controlled Vital Signs Assessment: post-procedure vital signs reviewed and stable Respiratory status: spontaneous breathing, nonlabored ventilation and respiratory function stable Cardiovascular status: blood pressure returned to baseline and stable Postop Assessment: no signs of nausea or vomiting Anesthetic complications: no    Last Vitals:  Vitals:   07/02/16 1045 07/02/16 1100  BP: 135/90 (!) 128/94  Pulse: 90 (!) 104  Resp: (!) 25 (!) 25  Temp:  36.5 C    Last Pain:  Vitals:   07/02/16 1100  TempSrc:   PainSc: 2    Pain Goal: Patients Stated Pain Goal: 2 (07/02/16 0614)               Lynda Rainwater

## 2016-07-02 NOTE — H&P (Signed)
  History of Present Illness  General:  Pt presents for preop for Laparoscopic ovarian cystectomy due to suspected to be an endometrioma. Original ultrasound was done in October and November which showed a possible possibel endometrioma 2 cm. Pt was asymptomatic but in May cyst increased to 7 cm and pt was now experiencing moderate to severe pain. laparoscopy recommended at that time. Pt has had pain once this week. No pain last week. 2-3 episdoes of pain since last visit. Describes pain as Sharp stabbing, can't move, sudden onset. Last used Ibuprofen 2 weeks ago.   Current Medications  Taking   Ibuprofen 800 MG Tablet 1 tablet Orally every 8 hrs x 24 hours prior to menses then q 8 hours prn pain   Not-Taking   Diflucan(Fluconazole) 150 MG Tablet 1 tablet Orally Once. Repeat in 3 days., Notes: did not take   Medication List reviewed and reconciled with the patient    Past Medical History  Asthma-Last episode 2011.           Surgical History  Bunion removed on right foot 2015   Family History  Father: alive, A + W  Mother: alive, A + W, asthma  Paternal [place] Mother: alive, Possible ovarian cancer  Maternal Grand Father: deceased, diagnosed with Colon cancer  Maternal Grand Mother: alive, diagnosed with Diabetes   Social History  General:  Tobacco use  cigarettes: Never smoked Tobacco history last updated 06/25/2016 Alcohol: yes, beer, wine.  Caffeine: yes, soda, coffee.  Recreational drug use: yes, marijuana in college.  Exercise: yes, walks daily.  Marital Status: single, dating.  Children: none.  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    Gyn History  Sexual activity currently sexually active, same sex partner.  Periods : irregular.  Denies H/O Birth control none.  Last pap smear date 10/03/2015 - WNL.  Denies H/O Last mammogram date.  Abnormal pap smear yes, repeat pap only, no bx.  Denies H/O STD.    OB History  Never been pregnant per patient.     Allergies  PEANUTS/NUTS: anaphylaxis   Hospitalization/Major Diagnostic Procedure  Denies Past Hospitalization   Review of Systems  Denies fever/chills, chest pain, SOB, headaches, numbness/tingling. No h/o complication with anesthesia, bleeding disorders or blood clots.   Vital Signs  Wt 205, Wt change 2 lb, Ht 66.5, BMI 32.59, Pulse sitting 75, BP sitting 102/75, LMP: 05/05/16.   Physical Examination  GENERAL:  Patient appears alert and oriented.  General Appearance: well-appearing, well-developed, no acute distress.  Speech: clear.  LUNGS:  Auscultation: no wheezing/rhonchi/rales. CTA bilaterally.  HEART:  Heart sounds: normal. RRR. no murmur.  ABDOMEN:  General: soft nontender, nondistended, no masses.  FEMALE GENITOURINARY:  Pelvic vagina, cervix normal. Left adnexal fullness. mildly tender.Marland Kitchen  EXTREMITIES:  General: No edema or calf tenderness.     Assessments   1. Pre-operative clearance - Z01.818 (Primary)   2. Left ovarian cyst - N83.202, suspect endometrioma.Cause of pelvic pain.   3. Pelvic pain - R10.2   Treatment  1. Left ovarian cyst  Notes: Pt counseled on risk of surgery. Informed oophrectomy may be necessary due to inability to complete cystectomy or scarring. Also informed of possibilty of ex-lap due to possible adhesions related to suspected endometriosis.    Follow Up  2 Weeks post op

## 2016-07-02 NOTE — Brief Op Note (Signed)
07/02/2016  10:18 AM  PATIENT:  Desiree Martinez  26 y.o. female  PRE-OPERATIVE DIAGNOSIS:  N83.299 Complex Ovarian Cyst  POST-OPERATIVE DIAGNOSIS:  Left endometrioma, endometriosis, pelvic adhesions  PROCEDURE:  Procedure(s): LAPAROSCOPIC OVARIAN CYSTECTOMY, Lysis of Adhesions (Left)  SURGEON:  Surgeon(s) and Role:    Thurnell Lose, MD - Primary  PHYSICIAN ASSISTANT:   ASSISTANTS: Dr. Christophe Louis   ANESTHESIA:   general  EBL:  Total I/O In: 1700 [I.V.:1700] Out: 350 [Urine:200; Blood:150]  BLOOD ADMINISTERED:none  DRAINS: Urinary Catheter (Foley)   LOCAL MEDICATIONS USED:  0.25% MARCAINE     SPECIMEN:  Source of Specimen:  left ovarian cyst wall  DISPOSITION OF SPECIMEN:  PATHOLOGY  COUNTS:  YES  TOURNIQUET:  * No tourniquets in log *  DICTATION: .Other Dictation: Dictation Number [id number]  PLAN OF CARE: Discharge to home after PACU  PATIENT DISPOSITION:  PACU - hemodynamically stable.   Delay start of Pharmacological VTE agent (>24hrs) due to surgical blood loss or risk of bleeding: yes

## 2016-07-02 NOTE — Transfer of Care (Signed)
Immediate Anesthesia Transfer of Care Note  Patient: Desiree Martinez  Procedure(s) Performed: Procedure(s): LAPAROSCOPIC OVARIAN CYSTECTOMY, Lysis of Adhesions (Left)  Patient Location: PACU  Anesthesia Type:General  Level of Consciousness: awake, alert  and oriented  Airway & Oxygen Therapy: Patient Spontanous Breathing and Patient connected to nasal cannula oxygen  Post-op Assessment: Report given to RN and Post -op Vital signs reviewed and stable  Post vital signs: Reviewed and stable  Last Vitals:  Vitals:   07/02/16 0614  BP: 122/76  Pulse: 75  Resp: 18  Temp: 36.1 C    Last Pain:  Vitals:   07/02/16 0614  TempSrc: Oral  PainSc: 1       Patients Stated Pain Goal: 2 (41/44/36 0165)  Complications: No apparent anesthesia complications

## 2016-07-02 NOTE — Anesthesia Procedure Notes (Signed)
Procedure Name: Intubation Date/Time: 07/02/2016 7:40 AM Performed by: Bufford Spikes Pre-anesthesia Checklist: Patient identified, Emergency Drugs available, Suction available and Patient being monitored Patient Re-evaluated:Patient Re-evaluated prior to inductionOxygen Delivery Method: Circle system utilized Preoxygenation: Pre-oxygenation with 100% oxygen Intubation Type: IV induction Ventilation: Mask ventilation without difficulty Laryngoscope Size: Miller and 2 Grade View: Grade I Tube type: Oral Tube size: 7.0 mm Number of attempts: 1 Airway Equipment and Method: Stylet Placement Confirmation: ETT inserted through vocal cords under direct vision,  positive ETCO2 and breath sounds checked- equal and bilateral Secured at: 21 cm Tube secured with: Tape Dental Injury: Teeth and Oropharynx as per pre-operative assessment

## 2016-07-02 NOTE — Op Note (Signed)
NAME:  Desiree, Martinez                  ACCOUNT NO.:  MEDICAL RECORD NO.:  [id number]  LOCATION:                                 FACILITY:  PHYSICIAN:  Jola Schmidt, MD   DATE OF BIRTH:  October 20, 1990  DATE OF PROCEDURE:  07/02/2016 DATE OF DISCHARGE:                              OPERATIVE REPORT   PREOPERATIVE DIAGNOSIS:  Left complex ovarian cyst.  POSTOPERATIVE DIAGNOSIS:  Left endometrioma, endometriosis, and pelvic adhesions.  PROCEDURE:  Left ovarian cystectomy with lysis of adhesions.  SURGEON:  Jola Schmidt, MD  ASSISTANT:  Christophe Louis, MD.  ANESTHESIA:  General.  ESTIMATED BLOOD LOSS:  150.  URINE OUT:  200.  BLOOD ADMINISTERED:  None.  DRAINS:  Foley catheter.  LOCAL:  Marcaine 0.25%.  SPECIMENS:  Left ovarian cyst wall.  DISPOSITION OF SPECIMEN:  To Pathology.  PLAN OF CARE:  Discharge home.  PATIENT DISPOSITION:  To PACU hemodynamically stable.  COMPLICATIONS:  None.  FINDINGS:  Left endometrioma, hemosiderin staining in the pelvis in the anterior and posterior cul-de-sac, adhesions in the posterior cul-de- sac.  Small omental adhesion to the anterior portion of the uterus and some small bowel adhesions in the cul-de-sac.  Right ovary appeared normal, but smooth consistent with PCOS.  Left ovarian cyst adhered to the left ovarian fossa.  Normal fallopian tubes bilaterally.  PROCEDURE IN DETAIL:  Desiree Martinez was identified in the holding area. She was then taken to the operating room and placed in the dorsal lithotomy position.  Ancef 2 g IV was administered and SCDs were on and operating.  She underwent general endotracheal anesthesia without complication.  She was then prepped and draped in a normal sterile fashion.  A time-out was performed.  A Graves speculum was inserted into the vagina.  The cervix was visualized and the anterior lip of the cervix was grasped with a single- tooth tenaculum.  The Hulka uterine manipulator was then  advanced through the uterus, and the tenaculum was removed.  Attention was then turned to the abdomen.  Infraumbilical incision was made for a 10 mm trocar that was then inserted under direct visualization.  The perforated towel clamps were used to hold the abdomen.  Once intraabdominal access was confirmed, abdomen was insufflated with CO2.  We would eventually put in two 5 mm trocars under direct visualization.  All incisions were placed after premedicating with Marcaine.  The abdomen was explored.  The findings above were noted.  We used the hot scissors to take off the omental adhesions.  The cyst was then visualized.  It was not very mobile, so an incision was made with the Harmonic scalpel in a vertical fashion until the cyst was entered.  We were then able to make a flap and pull off the cyst wall.  The cyst wall was very difficult to remove.  It did not peel easily.  There was bleeding noted.  We used several graspers to assist with the ease of removal; however, it was just densely adhered.  It was removed in pieces.  There was countertraction with the opposite trocar.  Once we got down to the base and no longer  peeled, the decision was made to come across at the base and we did that with the Harmonic and no excessive bleeding was noted.  Once the cyst wall was removed, hemostasis was achieved on the ovary with the Kleppinger.  The cyst wall was removed through the umbilical port without an EndoCatch.  The abdomen was copiously irrigated.  We did look in the cul-de-sac and break up some of those adhesions with the Harmonic and again copious irrigation was performed.  We went through 1 shot bag.  Once we felt confident that the ovary was hemostatic, arista was then placed on the ovary and it was observed.  There was some oozing through, so we used the Kleppinger again and it was very dry.  We placed arista and we did not see any oozing after that.  The upper abdomen was  then examined.  All clots in the irrigation were removed.  The Hulka uterine manipulator was removed from the cervix.  A Graves was inserted.  Silver nitrate was placed at the tenaculum site.  All instrument, sponge, and needle counts were correct x3.  The patient was awakened and went to the recovery room in stable condition.     Jola Schmidt, MD     EBV/MEDQ  D:  07/02/2016  T:  07/02/2016  Job:  [id number]

## 2016-07-02 NOTE — Progress Notes (Signed)
Charted in error.

## 2016-07-02 NOTE — Anesthesia Preprocedure Evaluation (Signed)
Anesthesia Evaluation  Patient identified by MRN, date of birth, ID band Patient awake    Reviewed: Allergy & Precautions, NPO status , Patient's Chart, lab work & pertinent test results  Airway Mallampati: II  TM Distance: >3 FB Neck ROM: Full    Dental no notable dental hx.    Pulmonary neg pulmonary ROS, asthma , former smoker,    Pulmonary exam normal breath sounds clear to auscultation       Cardiovascular negative cardio ROS Normal cardiovascular exam Rhythm:Regular Rate:Normal     Neuro/Psych negative neurological ROS  negative psych ROS   GI/Hepatic negative GI ROS, Neg liver ROS,   Endo/Other  negative endocrine ROS  Renal/GU negative Renal ROS  negative genitourinary   Musculoskeletal negative musculoskeletal ROS (+)   Abdominal   Peds negative pediatric ROS (+)  Hematology negative hematology ROS (+)   Anesthesia Other Findings   Reproductive/Obstetrics negative OB ROS                             Anesthesia Physical Anesthesia Plan  ASA: II  Anesthesia Plan: General   Post-op Pain Management:    Induction: Intravenous  PONV Risk Score and Plan: 4 or greater and Ondansetron, Dexamethasone, Propofol, Midazolam and Scopolamine patch - Pre-op  Airway Management Planned: Oral ETT  Additional Equipment:   Intra-op Plan:   Post-operative Plan: Extubation in OR  Informed Consent: I have reviewed the patients History and Physical, chart, labs and discussed the procedure including the risks, benefits and alternatives for the proposed anesthesia with the patient or authorized representative who has indicated his/her understanding and acceptance.   Dental advisory given  Plan Discussed with: CRNA  Anesthesia Plan Comments:         Anesthesia Quick Evaluation

## 2016-07-02 NOTE — Interval H&P Note (Signed)
History and Physical Interval Note:  07/02/2016 7:26 AM  Desiree Martinez  has presented today for surgery, with the diagnosis of N83.299 Complex Ovarian Cyst  The various methods of treatment have been discussed with the patient and family. After consideration of risks, benefits and other options for treatment, the patient has consented to  Procedure(s): LAPAROSCOPIC OVARIAN CYSTECTOMY (N/A) as a surgical intervention .  The patient's history has been reviewed, patient examined, no change in status, stable for surgery.  I have reviewed the patient's chart and labs.  Questions were answered to the patient's satisfaction.     Simona Huh, Puja Caffey

## 2016-07-02 NOTE — Discharge Instructions (Addendum)
Endometriosis Endometriosis is a condition in which the tissue that lines the uterus (endometrium) grows outside of its normal location. The tissue may grow in many locations close to the uterus, but it commonly grows on the ovaries, fallopian tubes, vagina, or bowel. When the uterus sheds the endometrium every menstrual cycle, there is bleeding wherever the endometrial tissue is located. This can cause pain because blood is irritating to tissues that are not normally exposed to it. What are the causes? The cause of endometriosis is not known. What increases the risk? You may be more likely to develop endometriosis if you:  Have a family history of endometriosis.  Have never given birth.  Started your period at age 27 or younger.  Have high levels of estrogen in your body.  Were exposed to a certain medicine (diethylstilbestrol) before you were born (in utero).  Had low birth weight.  Were born as a twin, triplet, or other multiple.  Have a BMI of less than 25. BMI is an estimate of body fat and is calculated from height and weight.  What are the signs or symptoms? Often, there are no symptoms of this condition. If you do have symptoms, they may:  Vary depending on where your endometrial tissue is growing.  Occur during your menstrual period (most common) or midcycle.  Come and go, or you may go months with no symptoms at all.  Stop with menopause.  Symptoms may include:  Pain in the back or abdomen.  Heavier bleeding during periods.  Pain during sex.  Painful bowel movements.  Infertility.  Pelvic pain.  Bleeding more than once a month.  How is this diagnosed? This condition is diagnosed based on your symptoms and a physical exam. You may have tests, such as:  Blood tests and urine tests. These may be done to help rule out other possible causes of your symptoms.  Ultrasound, to look for abnormal tissues.  An X-ray of the lower bowel (barium enema).  An  ultrasound that is done through the vagina (transvaginally).  CT scan.  MRI.  Laparoscopy. In this procedure, a lighted, pencil-sized instrument called a laparoscope is inserted into your abdomen through an incision. The laparoscope allows your health care provider to look at the organs inside your body and check for abnormal tissue to confirm the diagnosis. If abnormal tissue is found, your health care provider may remove a small piece of tissue (biopsy) to be examined under a microscope.  How is this treated? Treatment for this condition may include:  Medicines to relieve pain, such as NSAIDs.  Hormone therapy. This involves using artificial (synthetic) hormones to reduce endometrial tissue growth. Your health care provider may recommend using a hormonal form of birth control, or other medicines.  Surgery. This may be done to remove abnormal endometrial tissue. ? In some cases, tissue may be removed using a laparoscope and a laser (laparoscopic laser treatment). ? In severe cases, surgery may be done to remove the fallopian tubes, uterus, and ovaries (hysterectomy).  Follow these instructions at home:  Take over-the-counter and prescription medicines only as told by your health care provider.  Do not drive or use heavy machinery while taking prescription pain medicine.  Try to avoid activities that cause pain, including sexual activity.  Keep all follow-up visits as told by your health care provider. This is important. Contact a health care provider if:  You have pain in the area between your hip bones (pelvic area) that occurs: ? Before, during, or  after your period. ? In between your period and gets worse during your period. ? During or after sex. ? With bowel movements or urination, especially during your period.  You have problems getting pregnant.  You have a fever. Get help right away if:  You have severe pain that does not get better with medicine.  You have severe  nausea and vomiting, or you cannot eat without vomiting.  You have pain that affects only the lower, right side of your abdomen.  You have abdominal pain that gets worse.  You have abdominal swelling.  You have blood in your stool. This information is not intended to replace advice given to you by your health care provider. Make sure you discuss any questions you have with your health care provider. Document Released: 12/21/1999 Document Revised: 09/28/2015 Document Reviewed: 05/26/2015 Elsevier Interactive Patient Education  2018 [employer].   Diagnostic Laparoscopy, Care After Refer to this sheet in the next few weeks. These instructions provide you with information about caring for yourself after your procedure. Your health care provider may also give you more specific instructions. Your treatment has been planned according to current medical practices, but problems sometimes occur. Call your health care provider if you have any problems or questions after your procedure. What can I expect after the procedure? After your procedure, it is common to have mild discomfort in the throat and abdomen. Follow these instructions at home:  Take over-the-counter and prescription medicines only as told by your health care provider.  Do not drive for 24 hours if you received a sedative.  Return to your normal activities as told by your health care provider.  Do not take baths, swim, or use a hot tub until your health care provider approves. You may shower.  Follow instructions from your health care provider about how to take care of your incision. Make sure you: ? Wash your hands with soap and water before you change your bandage (dressing). If soap and water are not available, use hand sanitizer. ? Change your dressing as told by your health care provider. ? Leave stitches (sutures), skin glue, or adhesive strips in place. These skin closures may need to stay in place for 2 weeks or longer.  If adhesive strip edges start to loosen and curl up, you may trim the loose edges. Do not remove adhesive strips completely unless your health care provider tells you to do that.  Check your incision area every day for signs of infection. Check for: ? More redness, swelling, or pain. ? More fluid or blood. ? Warmth. ? Pus or a bad smell.  It is your responsibility to get the results of your procedure. Ask your health care provider or the department performing the procedure when your results will be ready. Contact a health care provider if:  There is new pain in your shoulders.  You feel light-headed or faint.  You are unable to pass gas or unable to have a bowel movement.  You feel nauseous or you vomit.  You develop a rash.  You have more redness, swelling, or pain around your incision.  You have more fluid or blood coming from your incision.  Your incision feels warm to the touch.  You have pus or a bad smell coming from your incision.  You have a fever or chills. Get help right away if:  Your pain is getting worse.  You have ongoing vomiting.  The edges of your incision open up.  You have  trouble breathing.  You have chest pain. This information is not intended to replace advice given to you by your health care provider. Make sure you discuss any questions you have with your health care provider. Document Released: 12/04/2014 Document Revised: 05/31/2015 Document Reviewed: 09/05/2014 Elsevier Interactive Patient Education  2018 [employer]: Laparoscopic ovarian cystectomy/lysis of adhesions  The following instructions have been prepared to help you care for yourself upon your return home today.  Wound care:  Do not get the incision wet for the first 24 hours. The incision should be kept clean and dry.  The belly button bandage may be removed the 2nd day after surgery(Friday).  Should the incision become sore,  red, and swollen after the first week, check with your doctor. -Surgical glue on all incisions will flake off on its own.  DO NOT PULL IT OFF.  Personal hygiene:  Shower the day after your procedure.  Activity and limitations: -No sexual activity to include any vaginal entry(sex, tampons, douching, etc.) for 2 weeks  Do NOT drive or operate any equipment today.  Do NOT lift anything more than 15 pounds for 2 weeks after surgery.  Do NOT rest in bed all day.  Walking is encouraged. Walk each day, starting slowly with 5-minute walks 3 or 4 times a day. Slowly increase the length of your walks.  Walk up and down stairs slowly.  Do NOT do strenuous activities, such as golfing, playing tennis, bowling, running, biking, weight lifting, gardening, mowing, or vacuuming for 2-4 weeks. Ask your doctor when it is okay to start.  Diet: Eat a light meal as desired this evening. You may resume your usual diet tomorrow.  Return to work: This is dependent on the type of work you do. For the most part you can return to a desk job within a week of surgery. If you are more active at work, please discuss this with your doctor.  What to expect after your surgery: You may have a slight burning sensation when you urinate on the first day. You may have a very small amount of blood in the urine. Expect to have a small amount of vaginal discharge/light bleeding for 1-2 weeks. It is not unusual to have abdominal soreness and bruising for up to 2 weeks. You may be tired and need more rest for about 1 week. You may experience shoulder pain for 24-72 hours. Lying flat in bed may relieve it.  Call your doctor for any of the following:  Develop a fever of 100.4 or greater  Inability to urinate 6 hours after discharge from hospital  Severe pain not relieved by pain medications  Persistent of heavy bleeding at incision site  Redness or swelling around incision site after a week  Increasing nausea or  vomiting  Patient Signature________________________________________  Nurse Signature_________________________________________  Support person's signature______________________________

## 2016-07-03 ENCOUNTER — Encounter (HOSPITAL_COMMUNITY): Payer: Self-pay | Admitting: Obstetrics and Gynecology

## 2017-11-02 IMAGING — DX DG ABDOMEN 1V
1 series · 1 of 1 positions shown · non-contrast
Comparison: [DATE]

CLINICAL DATA: Upper abdominal pain.

EXAM:
ABDOMEN - 1 VIEW

[abdomen kub]
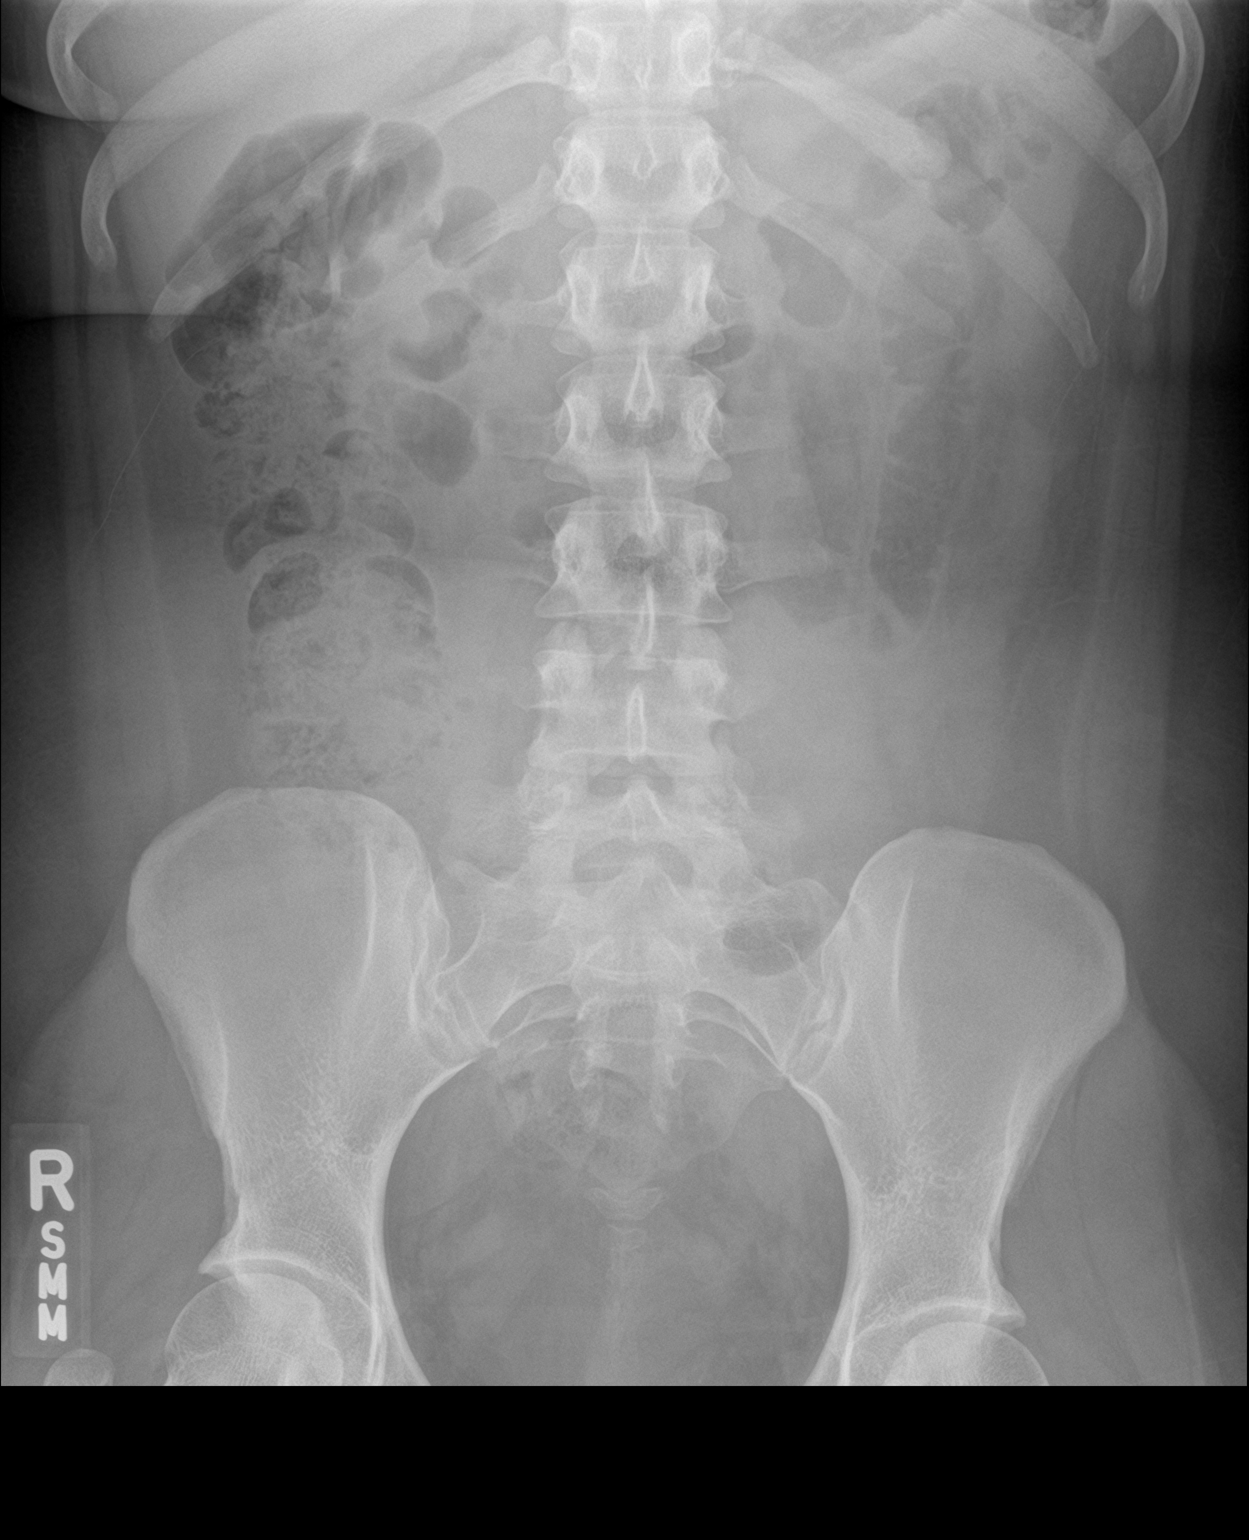

[1 of 1 positions shown; findings below may reference images not displayed]

FINDINGS: The bowel gas pattern is normal. No radio-opaque calculi or other
significant radiographic abnormality are seen.
IMPRESSION: Negative.

## 2019-01-08 ENCOUNTER — Other Ambulatory Visit: Payer: Self-pay

## 2019-01-08 ENCOUNTER — Emergency Department (HOSPITAL_COMMUNITY)
Admission: EM | Admit: 2019-01-08 | Discharge: 2019-01-08 | Disposition: A | Discharge status: Home / Self Care | Payer: 59 | Attending: Emergency Medicine | Admitting: Emergency Medicine

## 2019-01-08 DIAGNOSIS — Z91010 Allergy to peanuts: Secondary | ICD-10-CM | POA: Diagnosis not present

## 2019-01-08 DIAGNOSIS — L02412 Cutaneous abscess of left axilla: Secondary | ICD-10-CM | POA: Diagnosis not present

## 2019-01-08 DIAGNOSIS — R2232 Localized swelling, mass and lump, left upper limb: Secondary | ICD-10-CM | POA: Diagnosis present

## 2019-01-08 DIAGNOSIS — J45909 Unspecified asthma, uncomplicated: Secondary | ICD-10-CM | POA: Diagnosis not present

## 2019-01-08 DIAGNOSIS — L0291 Cutaneous abscess, unspecified: Secondary | ICD-10-CM

## 2019-01-08 DIAGNOSIS — Z87891 Personal history of nicotine dependence: Secondary | ICD-10-CM | POA: Diagnosis not present

## 2019-01-08 MED ORDER — LIDOCAINE-EPINEPHRINE (PF) 2 %-1:200000 IJ SOLN: 10.0000 mL | Freq: Once | INTRAMUSCULAR | Status: DC

## 2019-01-08 MED ORDER — ONDANSETRON 8 MG PO TBDP
8.0000 mg | ORAL_TABLET | Freq: Once | ORAL | Status: AC
  Administered 2019-01-08: 8 mg via ORAL
  Filled 2019-01-08: qty 1

## 2019-01-08 MED ORDER — HYDROMORPHONE HCL 2 MG/ML IJ SOLN
2.0000 mg | Freq: Once | INTRAMUSCULAR | Status: AC
  Administered 2019-01-08: 2 mg via INTRAMUSCULAR

## 2019-01-08 MED ORDER — HYDROMORPHONE HCL 2 MG/ML IJ SOLN
2.0000 mg | Freq: Once | INTRAMUSCULAR | Status: DC
  Filled 2019-01-08: qty 1

## 2019-01-08 MED ORDER — LIDOCAINE-EPINEPHRINE (PF) 2 %-1:200000 IJ SOLN
10.0000 mL | Freq: Once | INTRAMUSCULAR | Status: AC
  Administered 2019-01-08: 10 mL
  Filled 2019-01-08: qty 20

## 2019-01-08 NOTE — ED Notes (Signed)
Provider at bedside

## 2019-01-08 NOTE — ED Triage Notes (Signed)
Pt arrived POV ambulatory into ED from home CC left underarm abbesses X 4 days. Pt reports limited movement and 8/10 pain. VSS Afebrile. Pt reports taking OTC NSAIDS @ 1200 today

## 2019-01-08 NOTE — ED Notes (Signed)
dressing applied

## 2019-01-08 NOTE — Discharge Instructions (Signed)
Use warm moist compresses on the sore area numerous times during the day, until the pain and swelling have improved.  In 3 to 4 days, if the packing is still there, remove it by pulling it straight out.  Continue the warm compresses after that until the wound is completely healed.  Call the general surgeon office for a follow-up appointment to be seen about the recurrent abscesses.

## 2019-01-08 NOTE — ED Provider Notes (Signed)
[facility] DEPT Provider Note   CSN: [id number] Arrival date & time: 01/08/19  1727     History Chief Complaint  Patient presents with  . Abscess    left underarm    Desiree Martinez is a 29 y.o. female.  HPI She presents for evaluation of tenderness with swelling in the left axilla, for 4 days.  Pain is worsening and she is barely able to move her left arm.  She denies fever, chills, nausea, vomiting, weakness or dizziness.  She has had abscesses previously but not had to have them opened.  There are no other known modifying factors.    Past Medical History:  Diagnosis Date  . Asthma    Hx as a child - no problems as adult - no inhaler  . Seasonal allergies     Patient Active Problem List   Diagnosis Date Noted  . Unspecified vitamin D deficiency 07/14/2012    Past Surgical History:  Procedure Laterality Date  . LAPAROSCOPIC OVARIAN CYSTECTOMY Left 07/02/2016   Procedure: LAPAROSCOPIC OVARIAN CYSTECTOMY, Lysis of Adhesions;  Surgeon: Thurnell Lose, MD;  Location: [facility];  Service: Gynecology;  Laterality: Left;  . right foot surgery Right 06/2013   bunionectomy     OB History   No obstetric history on file.     No family history on file.  Social History   Tobacco Use  . Smoking status: Former Smoker    Years: 6.00    Types: Cigars    Quit date: 01/07/2016    Years since quitting: 3.0  . Smokeless tobacco: Never Used  Substance Use Topics  . Alcohol use: Yes    Comment: socially   . Drug use: Yes    Types: Marijuana    Comment: Last use 04/2016    Home Medications Prior to Admission medications   Medication Sig Start Date End Date Taking? Authorizing Provider  Hyoscyamine Sulfate SL (LEVSIN/SL) 0.125 MG SUBL Place 1 tablet under the tongue every 8 (eight) hours as needed. Patient taking differently: Place 1 tablet under the tongue every 8 (eight) hours as needed (pain).  05/21/16   Robyn Haber, MD  ibuprofen  (ADVIL,MOTRIN) 800 MG tablet Take 1 tablet (800 mg total) by mouth 3 (three) times daily as needed for moderate pain. 07/02/16   Thurnell Lose, MD  oxyCODONE-acetaminophen (PERCOCET/ROXICET) 5-325 MG tablet Take 1-2 tablets by mouth every 4 (four) hours as needed for severe pain. 07/02/16   Thurnell Lose, MD    Allergies    Peanuts [peanut oil]  Review of Systems   Review of Systems  All other systems reviewed and are negative.   Physical Exam Updated Vital Signs BP 129/85   Pulse (!) 110   Temp 99.5 F (37.5 C) (Oral)   Resp 18   SpO2 93%   Physical Exam Vitals and nursing note reviewed.  Constitutional:      General: She is in acute distress.     Appearance: She is well-developed. She is not ill-appearing, toxic-appearing or diaphoretic.  HENT:     Head: Normocephalic and atraumatic.     Right Ear: External ear normal.     Left Ear: External ear normal.  Eyes:     Conjunctiva/sclera: Conjunctivae normal.     Pupils: Pupils are equal, round, and reactive to light.  Neck:     Trachea: Phonation normal.  Cardiovascular:     Rate and Rhythm: Tachycardia present.  Pulmonary:     Effort: Pulmonary effort  is normal.  Musculoskeletal:        General: Normal range of motion.     Cervical back: Normal range of motion and neck supple.     Comments: Exquisitely tender mass left axilla, area of induration and swelling about 7 cm.  Skin:    General: Skin is warm and dry.  Neurological:     Mental Status: She is alert and oriented to person, place, and time.     Cranial Nerves: No cranial nerve deficit.     Sensory: No sensory deficit.     Motor: No abnormal muscle tone.     Coordination: Coordination normal.  Psychiatric:        Mood and Affect: Mood normal.        Behavior: Behavior normal.        Thought Content: Thought content normal.        Judgment: Judgment normal.     ED Results / Procedures / Treatments   Labs (all labs ordered are listed, but only abnormal  results are displayed) Labs Reviewed - No data to display  EKG None  Radiology No results found.  Procedures .Marland KitchenIncision and Drainage  Date/Time: 01/08/2019 8:54 PM Performed by: Daleen Bo, MD Authorized by: Daleen Bo, MD   Consent:    Consent obtained:  Verbal   Consent given by:  Patient   Risks discussed:  Incomplete drainage and pain   Alternatives discussed:  No treatment Location:    Type:  Abscess   Size:  5 cm   Location: left axcilla. Pre-procedure details:    Skin preparation:  Betadine Anesthesia (see MAR for exact dosages):    Anesthesia method:  Local infiltration   Local anesthetic:  Lidocaine 2% WITH epi Procedure type:    Complexity:  Simple Procedure details:    Needle aspiration: no     Incision types:  Single straight   Drainage:  Purulent   Drainage amount:  Moderate   Wound treatment:  Drain placed   Packing materials:  1/4 in iodoform gauze Post-procedure details:    Patient tolerance of procedure:  Tolerated well, no immediate complications   (including critical care time)  Medications Ordered in ED Medications  HYDROmorphone (DILAUDID) injection 2 mg (2 mg Intravenous Not Given 01/08/19 1951)  ondansetron (ZOFRAN-ODT) disintegrating tablet 8 mg (8 mg Oral Given 01/08/19 1935)  lidocaine-EPINEPHrine (XYLOCAINE W/EPI) 2 %-1:200000 (PF) injection 10 mL (10 mLs Infiltration Given by Other 01/08/19 2046)  HYDROmorphone (DILAUDID) injection 2 mg (2 mg Intramuscular Given 01/08/19 1953)    ED Course  I have reviewed the triage vital signs and the nursing notes.  Pertinent labs & imaging results that were available during my care of the patient were reviewed by me and considered in my medical decision making (see chart for details).    MDM Rules/Calculators/A&P                       Patient Vitals for the past 24 hrs:  BP Temp Temp src Pulse Resp SpO2  01/08/19 1753 129/85 99.5 F (37.5 C) Oral (!) 110 18 93 %    8:55 PM Reevaluation  with update and discussion. After initial assessment and treatment, an updated evaluation reveals she is comfortable this time has no further complaints.  Findings discussed and questions answered. Daleen Bo   Medical Decision Making: Left axilla abscess, recurrent.  Doubt deep tissue infection or sepsis.  CRITICAL CARE-no Performed by: Daleen Bo   Nursing  Notes Reviewed/ Care Coordinated Applicable Imaging Reviewed Interpretation of Laboratory Data incorporated into ED treatment  The patient appears reasonably screened and/or stabilized for discharge and I doubt any other medical condition or other [facility] requiring further screening, evaluation, or treatment in the ED at this time prior to discharge.  Plan: Home Medications-OTC analgesia of choice; Home Treatments-compresses, remove packing in 3 to 4 days; return here if the recommended treatment, does not improve the symptoms; Recommended follow up-return here as needed.  General surgery referral for management of recurrent abscesses.  Final Clinical Impression(s) / ED Diagnoses Final diagnoses:  Abscess    Rx / DC Orders ED Discharge Orders    None       Daleen Bo, MD 01/08/19 2057

## 2019-02-04 ENCOUNTER — Ambulatory Visit: Payer: 59 | Attending: Internal Medicine

## 2019-02-04 DIAGNOSIS — Z20822 Contact with and (suspected) exposure to covid-19: Secondary | ICD-10-CM

## 2019-02-05 LAB — NOVEL CORONAVIRUS, NAA: SARS-CoV-2, NAA: NOT DETECTED

## 2019-04-08 ENCOUNTER — Ambulatory Visit: Payer: 59 | Attending: Internal Medicine

## 2019-04-08 DIAGNOSIS — Z20822 Contact with and (suspected) exposure to covid-19: Secondary | ICD-10-CM

## 2019-04-09 LAB — SARS-COV-2, NAA 2 DAY TAT

## 2019-04-09 LAB — NOVEL CORONAVIRUS, NAA: SARS-CoV-2, NAA: NOT DETECTED

## 2019-07-25 ENCOUNTER — Other Ambulatory Visit: Payer: 59

## 2019-12-07 DIAGNOSIS — Z8616 Personal history of COVID-19: Secondary | ICD-10-CM

## 2019-12-07 HISTORY — DX: Personal history of COVID-19: Z86.16

## 2021-01-02 ENCOUNTER — Encounter (HOSPITAL_BASED_OUTPATIENT_CLINIC_OR_DEPARTMENT_OTHER): Payer: Self-pay | Admitting: Obstetrics and Gynecology

## 2021-01-02 ENCOUNTER — Other Ambulatory Visit: Payer: Self-pay

## 2021-01-02 DIAGNOSIS — Z01812 Encounter for preprocedural laboratory examination: Secondary | ICD-10-CM | POA: Diagnosis present

## 2021-01-02 NOTE — Progress Notes (Addendum)
PLEASE WEAR A MASK OUT IN PUBLIC AND SOCIAL DISTANCE AND [place] YOUR HANDS FREQUENTLY. PLEASE ASK ALL YOUR CLOSE HOUSEHOLD CONTACT TO WEAR MASK OUT IN PUBLIC AND SOCIAL DISTANCE AND [place] HANDS FREQUENTLY ALSO.      Your procedure is scheduled on  01-16-2021  Report to [facility]   Call this number if you have problems the morning of surgery  :[phone].   OUR ADDRESS IS [address].  WE ARE LOCATED IN [facility].  PLEASE BRING YOUR INSURANCE CARD AND PHOTO ID DAY OF SURGERY.  ONLY ONE PERSON ALLOWED IN FACILITY WAITING AREA.                                     REMEMBER:  DO NOT EAT FOOD, CANDY GUM OR MINTS  AFTER MIDNIGHT THE NIGHT BEFORE YOUR SURGERY . YOU MAY HAVE CLEAR LIQUIDS FROM MIDNIGHT THE NIGHT BEFORE YOUR SURGERY UNTIL 530 AM. NO CLEAR LIQUIDS AFTER 530 AM DAY OF SURGERY.   YOU MAY  BRUSH YOUR TEETH MORNING OF SURGERY AND RINSE YOUR MOUTH OUT, NO CHEWING GUM CANDY OR MINTS.    CLEAR LIQUID DIET   Foods Allowed                                                                     Foods Excluded  Coffee and tea, regular and decaf                             liquids that you cannot  Plain Jell-O any favor except red or purple                                           see through such as: Fruit ices (not with fruit pulp)                                     milk, soups, orange juice  Iced Popsicles                                    All solid food Carbonated beverages, regular and diet                                    Cranberry, grape and apple juices Sports drinks like Gatorade Sugar  Sample Menu Breakfast                                Lunch  Supper Cranberry juice                                           Jell-O                                     Grape juice                           Apple juice Coffee or tea                        Jell-O                                       Popsicle                                                Coffee or tea                        Coffee or tea  _____________________________________________________________________     TAKE THESE MEDICATIONS MORNING OF SURGERY WITH A SIP OF WATER:   NONE  ONE VISITOR IS ALLOWED IN WAITING ROOM ONLY DAY OF SURGERY.  YOU MAY HAVE ANOTHER PERSON SWITCH OUT WITH THE  1  VISITOR IN THE WAITING ROOM DAY OF SURGERY AND A MASK MUST BE WORN IN THE WAITING ROOM.    2 VISITORS  MAY VISIT IN THE EXTENDED RECOVERY ROOM UNTIL 800 PM ONLY 1 VISITOR AGE 63 AND OVER MAY SPEND THE NIGHT AND MUST BE IN EXTENDED RECOVERY ROOM NO LATER THAN 800 PM .    ALL PERSONS VISITING IN EXTENDED RECOVERY ROOM MUST WEAR A MASK.                                    DO NOT WEAR JEWERLY, MAKE UP. DO NOT WEAR LOTIONS, POWDERS, PERFUMES OR NAIL POLISH ON YOUR FINGERNAILS. TOENAIL POLISH IS OK TO WEAR. DO NOT SHAVE FOR 48 HOURS PRIOR TO DAY OF SURGERY. MEN MAY SHAVE FACE AND NECK. CONTACTS, GLASSES, OR DENTURES MAY NOT BE WORN TO SURGERY.                                    [facility] IS NOT RESPONSIBLE  FOR ANY BELONGINGS.                                                                    Marland Kitchen           [facility] - Preparing for Surgery Before surgery, you can play an important role.  Because skin is not sterile, your  skin needs to be as free of germs as possible.  You can reduce the number of germs on your skin by washing with CHG (chlorahexidine gluconate) soap before surgery.  CHG is an antiseptic cleaner which kills germs and bonds with the skin to continue killing germs even after washing. Please DO NOT use if you have an allergy to CHG or antibacterial soaps.  If your skin becomes reddened/irritated stop using the CHG and inform your nurse when you arrive at Short Stay. Do not shave (including legs and underarms) for at least 48 hours prior to the first CHG shower.  You may shave your face/neck. Please follow these  instructions carefully:  1.  Shower with CHG Soap the night before surgery and the  morning of Surgery.  2.  If you choose to wash your hair, wash your hair first as usual with your  normal  shampoo.  3.  After you shampoo, rinse your hair and body thoroughly to remove the  shampoo.                            4.  Use CHG as you would any other liquid soap.  You can apply chg directly  to the skin and wash                      Gently with a scrungie or clean washcloth.  5.  Apply the CHG Soap to your body ONLY FROM THE NECK DOWN.   Do not use on face/ open                           Wound or open sores. Avoid contact with eyes, ears mouth and genitals (private parts).                       Wash face,  Genitals (private parts) with your normal soap.             6.  Wash thoroughly, paying special attention to the area where your surgery  will be performed.  7.  Thoroughly rinse your body with warm water from the neck down.  8.  DO NOT shower/wash with your normal soap after using and rinsing off  the CHG Soap.                9.  Pat yourself dry with a clean towel.            10.  Wear clean pajamas.            11.  Place clean sheets on your bed the night of your first shower and do not  sleep with pets. Day of Surgery : Do not apply any lotions/deodorants the morning of surgery.  Please wear clean clothes to the hospital/surgery center.  IF YOU HAVE ANY SKIN IRRITATION OR PROBLEMS WITH THE SURGICAL SOAP, PLEASE GET A BAR OF GOLD DIAL SOAP AND SHOWER THE NIGHT BEFORE YOUR SURGERY AND THE MORNING OF YOUR SURGERY. PLEASE LET THE NURSE KNOW MORNING OF YOUR SURGERY IF YOU HAD ANY PROBLEMS WITH THE SURGICAL SOAP.  FAILURE TO FOLLOW THESE INSTRUCTIONS MAY RESULT IN THE CANCELLATION OF YOUR SURGERY PATIENT SIGNATURE_________________________________  NURSE SIGNATURE__________________________________  ________________________________________________________________________  QUESTIONS CALL [facility] PRE [facility] NURSE PHONE [phone].

## 2021-01-02 NOTE — Progress Notes (Signed)
Spoke w/ via phone for pre-op interview---pt Lab needs dos---- urine poct per anesthesia surgery orders pending              Lab results------none COVID test -----patient states asymptomatic no test needed Arrive at -------630 am 01-16-2021 NPO after MN NO Solid Food.  Clear liquids from MN until---530 am  Med rec completed Medications to take morning of surgery -----none Diabetic medication -----n/a Patient instructed no nail polish to be worn day of surgery Patient instructed to bring photo id and insurance card day of surgery Patient aware to have Driver (ride ) / caregiver    for 24 hours after surgery  wife Desiree Martinez cell [phone] Patient Special Instructions -----pt given extended recovery instructions Pre-Op special Istructions -----orders req dr Landry Mellow epic ib Patient verbalized understanding of instructions that were given at this phone interview. Patient denies shortness of breath, chest pain, fever, cough at this phone interview.

## 2021-01-11 ENCOUNTER — Encounter (HOSPITAL_COMMUNITY)
Admission: RE | Admit: 2021-01-11 | Discharge: 2021-01-11 | Disposition: A | Discharge status: Home / Self Care | Payer: 59 | Source: Ambulatory Visit | Attending: Obstetrics and Gynecology | Admitting: Obstetrics and Gynecology

## 2021-01-11 ENCOUNTER — Other Ambulatory Visit: Payer: Self-pay

## 2021-01-11 DIAGNOSIS — Z01812 Encounter for preprocedural laboratory examination: Secondary | ICD-10-CM | POA: Insufficient documentation

## 2021-01-11 DIAGNOSIS — Z01818 Encounter for other preprocedural examination: Secondary | ICD-10-CM

## 2021-01-11 LAB — CBC
HCT: 39.1 % (ref 36.0–46.0)
Hemoglobin: 12.3 g/dL (ref 12.0–15.0)
MCH: 28 pg (ref 26.0–34.0)
MCHC: 31.5 g/dL (ref 30.0–36.0)
MCV: 89.1 fL (ref 80.0–100.0)
Platelets: 291 10*3/uL (ref 150–400)
RBC: 4.39 MIL/uL (ref 3.87–5.11)
RDW: 16.2 % — ABNORMAL HIGH (ref 11.5–15.5)
WBC: 8.8 10*3/uL (ref 4.0–10.5)
nRBC: 0 % (ref 0.0–0.2)

## 2021-01-14 ENCOUNTER — Other Ambulatory Visit: Payer: Self-pay | Admitting: Obstetrics and Gynecology

## 2021-01-14 DIAGNOSIS — N939 Abnormal uterine and vaginal bleeding, unspecified: Secondary | ICD-10-CM

## 2021-01-14 NOTE — H&P (Signed)
Reason for Appointment  1. Preop- Fibroids / abnormal uterine bleeding / left ovarian mass    History of Present Illness  General:  31 yo presents for a pre-op visit for robotic-assisted laparoscopic hysterectomy with bilateral salpingectomy and left oophorectomy.  Pt had pelvic US on 08/08/2020 that was notable for uterine fibroid measuring 5.6 cm increased in size from previous US 05/22/2016. Additionally, the left ovary was enlarged (6.69 cm) containing complex mass(es) that resemble endometriomas. The right ovary appeared normal. Pt has a history of endometriosis determined by laparoscopy on July 2018. After surgery, menses regular until ~04/2020 - reports worsening dysmenorrhea.  Pt was seen 10/02/2020 and reported that during her LMP she bled for 2.5 weeks - week 1 regular flow, then week 2 heavy "sand-brown discharge," changing saturated pad 4-5x/day. Prior to LMP, menses lasting 7 days with 4 days heavy, changing pad 7-8x on heaviest days. Taking ibuprofen 800mg  every 8 hours PRN for dysmenorrhea - causes drowsiness but mostly helps with pain. Tried COC for menstrual regulation previously, but d/c because COC "took a toll" on physical and mental health. Patient is interested in surgical intervention / possible hysterectomy - same sex partner, plans to adopt children in future.  Ultrasound Results Completed on December 27th 2022  Uterus: 8.2 cm X 8.1 cm X 6.9 cm Endometrium: 1.07 cm L Ovary: Complex mass measuring 6.5 cm (stable in size from previous US in August), 4.0 cm mass (stable in size from previous US) R Ovary: Normal .   Current Medications  Taking   Ibuprofen 800 MG Tablet 1 tablet Orally every 8 hrs x 24 hours prior to menses then q 8 hours prn pain, Notes: prn  MVI   Medication List reviewed and reconciled with the patient   Past Medical History  Asthma-Last episode 2011.    Surgical History  Bunion removed on right foot 2015  laparoscopy-ovarian cystectomy 07/03/16    Family History  Father: alive, A + W  Mother: alive, A + W, asthma  Paternal [place] Mother: alive, Possible ovarian cancer  Maternal Grand Father: deceased, diagnosed with Colon cancer  Maternal Grand Mother: alive, diagnosed with Diabetes   Social History  General:  Tobacco use cigarettes: Current smoker, Frequency: 1/2 PPD, Tobacco history last updated 01/01/2021, Vaping No. Alcohol: yes, beer, wine. Caffeine: yes, soda, coffee. Recreational drug use: yes, marijuana. Exercise: yes, walks daily. Marital Status: engaged. Children: none. 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    Gyn History  Sexual activity currently sexually active, same sex partner.  Periods : irregular - heavy and extended cycles.  LMP 12/11/2020.  Denies H/O Birth control none.  Last pap smear date 07/26/2020- neg..  Denies H/O Last mammogram date.  Abnormal pap smear yes, repeat pap only, no bx.  Denies H/O STD.    OB History  Never been pregnant per patient.    Allergies  PEANUTS/NUTS: anaphylaxis   Hospitalization/Major Diagnostic Procedure  No Hospitalization History.   Review of Systems  CONSTITUTIONAL:  Appetite changes yes. Chills No. Fatigue No. Fever No. Night sweats No. Recent travel outside [place] No. Sweats No. Weight change No.  OPHTHALMOLOGY:  Blurring of vision no. Change in vision no. Double vision no.  ENT:  Dizziness no. Nose bleeds no. Sore throat no. Teeth pain no.  ALLERGY:  Hives no.  CARDIOLOGY:  Chest pain no. High blood pressure no. Irregular heart beat no. Leg edema no. Palpitations no.  RESPIRATORY:  Shortness of breath no. Cough no. Wheezing no.  UROLOGY:  Pain with urination no. Urinary urgency no. Urinary frequency no. Urinary incontinence no. Difficulty urinating No. Blood in urine No.  GASTROENTEROLOGY:  Abdominal pain yes. Appetite change no. Bloating/belching yes. Blood in stool or on toilet paper no. Change in bowel movements no. Constipation no.  Diarrhea no. Difficulty swallowing no. Nausea no.  FEMALE REPRODUCTIVE:  Vulvar pain no. Vulvar rash no. Abnormal vaginal bleeding no. Breast pain no. Nipple discharge no. Pain with intercourse no. Pelvic pain no. Unusual vaginal discharge no. Vaginal itching no.  MUSCULOSKELETAL:  Muscle aches no.  NEUROLOGY:  Headache no. Tingling/numbness no. Weakness no.  PSYCHOLOGY:  Depression no. Anxiety no. Nervousness no. Sleep disturbances no. Suicidal ideation no .  ENDOCRINOLOGY:  Excessive thirst no. Excessive urination no. Hair loss no. Heat or cold intolerance no.  HEMATOLOGY/LYMPH:  Abnormal bleeding no. Easy bruising no. Swollen glands no.  DERMATOLOGY:  New/changing skin lesion no. Rash no. Sores no.    Vital Signs  Wt 220.4, Wt change -1.2 lbs, Ht 66.5, BMI 35.04, Pulse sitting 102, BP sitting 126/86.   Examination  General Examination: CONSTITUTIONAL: alert, oriented, NAD. SKIN: moist, warm. EYES: Conjunctiva clear. LUNGS: good I:E efffort noted, clear to auscultation bilaterally. HEART: heart sounds are normal, rhythm is regular, no murmur. ABDOMEN: soft, non-tender/non-distended, bowel sounds present. FEMALE GENITOURINARY: normal external genitalia, labia - unremarkable, vagina - pink moist mucosa, no lesions or abnormal discharge, cervix - no discharge or lesions or CMT, adnexa - left adnexal fullness, uterus - nontender and 10-week size on palpation. PSYCH: affect normal, good eye contact.     Physical Examination  Chaperone present:  Chaperone present Antonieta Loveless 01/01/2021 10:41:23 AM > , for pelvic exam.     Assessments     1. Fibroids - D21.9 (Primary)   2. Complex cyst of left ovary - N83.292   3. Menorrhagia with irregular cycle - N92.1   Treatment  1. Fibroids  Notes: Planning robotic-assisted laparoscopic hysterectomy with bilateral salpingectomy and left oophorectomy. Pt advised she may stay overnight, or 2 days if conversion to larger incision.  Discussed risks of hysterectomy including but not limited to infection, bleeding, conversion to larger incision, damage to her bowel, bladder, or ureters, with the need for further surgery. Discussed risk of blood transfusion and risk of HIV or hep B&Cwith blood transfusion. Pt is aware of risks and desires blood transfusion if needed. Pt advised to avoid NSAIDs (Aspirin, Aleve, Advil, Ibuprofen, Motrin) from now until surgery given risk of bleeding during surgery. She may take Tylenol for pain management. She is advised to avoid eating or drinking starting midnight prior to surgery. Discussed post-surgery avoidance of driving for 1 week and avoidance of lifting weight greater than 10 lbs or intercourse for 6-8 weeks after procedure.    2. Complex cyst of left ovary  IMAGING: US ECHO TRANSVAGINAL Notes: Planning robotic-assisted laparoscopic hysterectomy with bilateral salpingectomy and left oophorectomy. Pt advised she may stay overnight, or 2 days if conversion to larger incision. Discussed risks of hysterectomy including but not limited to infection, bleeding, conversion to larger incision, damage to her bowel, bladder, or ureters, with the need for further surgery. Discussed risk of blood transfusion and risk of HIV or hep B&C with blood transfusion. Pt is aware of risks and desires blood transfusion if needed. Pt advised to avoid NSAIDs (Aspirin, Aleve, Advil, Ibuprofen, Motrin) from now until surgery given risk of bleeding during surgery. She may take Tylenol for pain management. She is advised to avoid eating  or drinking starting midnight prior to surgery. Discussed post-surgery avoidance of driving for 1 week and avoidance of lifting weight greater than 10 lbs or intercourse for 6-8 weeks after procedure.    3. Menorrhagia with irregular cycle  Notes: Planning robotic-assisted laparoscopic hysterectomy with bilateral salpingectomy and left oophorectomy. Pt advised she may stay overnight, or 2 days if  conversion to larger incision. Discussed risks of hysterectomy including but not limited to infection, bleeding, conversion to larger incision, damage to her bowel, bladder, or ureters, with the need for further surgery. Discussed risk of blood transfusion and risk of HIV or hep B&C ( with blood transfusion. Pt is aware of risks and desires blood transfusion if needed. Pt advised to avoid NSAIDs (Aspirin, Aleve, Advil, Ibuprofen, Motrin) from now until surgery given risk of bleeding during surgery. She may take Tylenol for pain management. She is advised to avoid eating or drinking starting midnight prior to surgery. Discussed post-surgery avoidance of driving for 1 week and avoidance of lifting weight greater than 10 lbs or intercourse for 6-8 weeks after procedure.

## 2021-01-14 NOTE — H&P (Deleted)
  The note originally documented on this encounter has been moved the the encounter in which it belongs.  

## 2021-01-15 NOTE — Anesthesia Preprocedure Evaluation (Addendum)
Anesthesia Evaluation  Patient identified by MRN, date of birth, ID band  Reviewed: Allergy & Precautions, NPO status , Patient's Chart, lab work & pertinent test results  Airway Mallampati: II  TM Distance: >3 FB Neck ROM: Full    Dental no notable dental hx.    Pulmonary asthma , former smoker,    Pulmonary exam normal breath sounds clear to auscultation       Cardiovascular Exercise Tolerance: Good negative cardio ROS Normal cardiovascular exam Rhythm:Regular Rate:Normal     Neuro/Psych negative neurological ROS  negative psych ROS   GI/Hepatic negative GI ROS, Neg liver ROS,   Endo/Other  obesity  Renal/GU negative Renal ROS  negative genitourinary   Musculoskeletal   Abdominal   Peds negative pediatric ROS (+)  Hematology negative hematology ROS (+)   Anesthesia Other Findings   Reproductive/Obstetrics fibroids                            Anesthesia Physical Anesthesia Plan  ASA: 2  Anesthesia Plan: General   Post-op Pain Management:    Induction: Intravenous  PONV Risk Score and Plan: 3 and Midazolam, Treatment may vary due to age or medical condition, Scopolamine patch - Pre-op, Dexamethasone and Ondansetron  Airway Management Planned: Oral ETT  Additional Equipment: None  Intra-op Plan:   Post-operative Plan: Extubation in OR  Informed Consent: I have reviewed the patients History and Physical, chart, labs and discussed the procedure including the risks, benefits and alternatives for the proposed anesthesia with the patient or authorized representative who has indicated his/her understanding and acceptance.     Dental advisory given  Plan Discussed with: Anesthesiologist and CRNA  Anesthesia Plan Comments:        Anesthesia Quick Evaluation

## 2021-01-16 ENCOUNTER — Encounter (HOSPITAL_BASED_OUTPATIENT_CLINIC_OR_DEPARTMENT_OTHER): Payer: Self-pay | Admitting: Obstetrics and Gynecology

## 2021-01-16 ENCOUNTER — Observation Stay (HOSPITAL_BASED_OUTPATIENT_CLINIC_OR_DEPARTMENT_OTHER)
Admission: RE | Admit: 2021-01-16 | Discharge: 2021-01-17 | Disposition: A | Discharge status: Home / Self Care | Payer: 59 | Source: Ambulatory Visit | Attending: Obstetrics and Gynecology | Admitting: Obstetrics and Gynecology

## 2021-01-16 ENCOUNTER — Encounter (HOSPITAL_BASED_OUTPATIENT_CLINIC_OR_DEPARTMENT_OTHER)
Admission: RE | Disposition: A | Discharge status: Home / Self Care | Payer: Self-pay | Source: Ambulatory Visit | Attending: Obstetrics and Gynecology

## 2021-01-16 ENCOUNTER — Observation Stay (HOSPITAL_BASED_OUTPATIENT_CLINIC_OR_DEPARTMENT_OTHER): Payer: 59 | Admitting: Anesthesiology

## 2021-01-16 DIAGNOSIS — D259 Leiomyoma of uterus, unspecified: Principal | ICD-10-CM | POA: Insufficient documentation

## 2021-01-16 DIAGNOSIS — F17210 Nicotine dependence, cigarettes, uncomplicated: Secondary | ICD-10-CM | POA: Insufficient documentation

## 2021-01-16 DIAGNOSIS — N8003 Adenomyosis of the uterus: Secondary | ICD-10-CM | POA: Insufficient documentation

## 2021-01-16 DIAGNOSIS — N838 Other noninflammatory disorders of ovary, fallopian tube and broad ligament: Secondary | ICD-10-CM | POA: Diagnosis not present

## 2021-01-16 DIAGNOSIS — J45909 Unspecified asthma, uncomplicated: Secondary | ICD-10-CM | POA: Diagnosis not present

## 2021-01-16 DIAGNOSIS — Z01818 Encounter for other preprocedural examination: Secondary | ICD-10-CM

## 2021-01-16 DIAGNOSIS — N939 Abnormal uterine and vaginal bleeding, unspecified: Secondary | ICD-10-CM | POA: Diagnosis present

## 2021-01-16 DIAGNOSIS — N83292 Other ovarian cyst, left side: Secondary | ICD-10-CM | POA: Insufficient documentation

## 2021-01-16 DIAGNOSIS — Z91010 Allergy to peanuts: Secondary | ICD-10-CM | POA: Insufficient documentation

## 2021-01-16 DIAGNOSIS — N80203 Endometriosis of bilateral fallopian tubes, unspecified depth: Secondary | ICD-10-CM | POA: Insufficient documentation

## 2021-01-16 HISTORY — PX: CYSTOSCOPY: SHX5120

## 2021-01-16 HISTORY — PX: ROBOTIC ASSISTED LAPAROSCOPIC HYSTERECTOMY AND SALPINGECTOMY: SHX6379

## 2021-01-16 HISTORY — DX: Unspecified asthma, uncomplicated: J45.909

## 2021-01-16 HISTORY — DX: Presence of spectacles and contact lenses: Z97.3

## 2021-01-16 LAB — TYPE AND SCREEN
ABO/RH(D): B POS
Antibody Screen: NEGATIVE

## 2021-01-16 LAB — POCT PREGNANCY, URINE: Preg Test, Ur: NEGATIVE

## 2021-01-16 LAB — ABO/RH: ABO/RH(D): B POS

## 2021-01-16 SURGERY — XI ROBOTIC ASSISTED LAPAROSCOPIC HYSTERECTOMY AND SALPINGECTOMY
Anesthesia: General | Site: Urethra

## 2021-01-16 MED ORDER — SODIUM CHLORIDE 0.9 % IV SOLN
2.0000 g | INTRAVENOUS | Status: AC
  Administered 2021-01-16: 2 g via INTRAVENOUS

## 2021-01-16 MED ORDER — METHYLENE BLUE 0.5 % INJ SOLN
INTRAVENOUS | Status: DC | PRN
  Administered 2021-01-16: 50 mg via INTRAVENOUS

## 2021-01-16 MED ORDER — PROMETHAZINE HCL 25 MG/ML IJ SOLN: 6.2500 mg | INTRAMUSCULAR | Status: DC | PRN

## 2021-01-16 MED ORDER — PANTOPRAZOLE SODIUM 40 MG PO TBEC
40.0000 mg | DELAYED_RELEASE_TABLET | Freq: Every day | ORAL | Status: DC
  Administered 2021-01-16: 40 mg via ORAL

## 2021-01-16 MED ORDER — OXYCODONE HCL 5 MG PO TABS
ORAL_TABLET | ORAL | Status: AC
  Filled 2021-01-16: qty 2

## 2021-01-16 MED ORDER — MENTHOL 3 MG MT LOZG: 1.0000 | LOZENGE | OROMUCOSAL | Status: DC | PRN

## 2021-01-16 MED ORDER — DEXAMETHASONE SODIUM PHOSPHATE 10 MG/ML IJ SOLN
INTRAMUSCULAR | Status: DC | PRN
  Administered 2021-01-16: 10 mg via INTRAVENOUS

## 2021-01-16 MED ORDER — SCOPOLAMINE 1 MG/3DAYS TD PT72
MEDICATED_PATCH | TRANSDERMAL | Status: AC
  Filled 2021-01-16: qty 1

## 2021-01-16 MED ORDER — KETOROLAC TROMETHAMINE 30 MG/ML IJ SOLN
INTRAMUSCULAR | Status: AC
  Filled 2021-01-16: qty 1

## 2021-01-16 MED ORDER — ONDANSETRON HCL 4 MG/2ML IJ SOLN
INTRAMUSCULAR | Status: AC
  Filled 2021-01-16: qty 2

## 2021-01-16 MED ORDER — ROCURONIUM BROMIDE 10 MG/ML (PF) SYRINGE
PREFILLED_SYRINGE | INTRAVENOUS | Status: DC | PRN
  Administered 2021-01-16: 80 mg via INTRAVENOUS
  Administered 2021-01-16: 10 mg via INTRAVENOUS
  Administered 2021-01-16: 20 mg via INTRAVENOUS

## 2021-01-16 MED ORDER — SIMETHICONE 80 MG PO CHEW
80.0000 mg | CHEWABLE_TABLET | Freq: Four times a day (QID) | ORAL | Status: DC | PRN
  Administered 2021-01-17: 80 mg via ORAL

## 2021-01-16 MED ORDER — HYDROMORPHONE HCL 1 MG/ML IJ SOLN
INTRAMUSCULAR | Status: DC | PRN
Start: 2021-01-16 — End: 2021-01-16
  Administered 2021-01-16: .5 mg via INTRAVENOUS
  Administered 2021-01-16: 1 mg via INTRAVENOUS
  Administered 2021-01-16: .5 mg via INTRAVENOUS

## 2021-01-16 MED ORDER — GABAPENTIN 100 MG PO CAPS
ORAL_CAPSULE | ORAL | Status: AC
  Filled 2021-01-16: qty 1

## 2021-01-16 MED ORDER — LIDOCAINE 2% (20 MG/ML) 5 ML SYRINGE
INTRAMUSCULAR | Status: AC
  Filled 2021-01-16: qty 5

## 2021-01-16 MED ORDER — ONDANSETRON HCL 4 MG/2ML IJ SOLN
INTRAMUSCULAR | Status: DC | PRN
  Administered 2021-01-16: 4 mg via INTRAVENOUS

## 2021-01-16 MED ORDER — ALUM & MAG HYDROXIDE-SIMETH 200-200-20 MG/5ML PO SUSP: 30.0000 mL | ORAL | Status: DC | PRN

## 2021-01-16 MED ORDER — SODIUM CHLORIDE 0.9 % IV SOLN
INTRAVENOUS | Status: AC
  Filled 2021-01-16: qty 2

## 2021-01-16 MED ORDER — MIDAZOLAM HCL 5 MG/5ML IJ SOLN
INTRAMUSCULAR | Status: DC | PRN
  Administered 2021-01-16: 2 mg via INTRAVENOUS

## 2021-01-16 MED ORDER — ACETAMINOPHEN 500 MG PO TABS
1000.0000 mg | ORAL_TABLET | Freq: Four times a day (QID) | ORAL | Status: DC
  Administered 2021-01-16 – 2021-01-17 (×4): 1000 mg via ORAL

## 2021-01-16 MED ORDER — HYDROMORPHONE HCL 1 MG/ML IJ SOLN: 0.2000 mg | INTRAMUSCULAR | Status: DC | PRN

## 2021-01-16 MED ORDER — FENTANYL CITRATE (PF) 100 MCG/2ML IJ SOLN
INTRAMUSCULAR | Status: DC | PRN
  Administered 2021-01-16 (×2): 100 ug via INTRAVENOUS
  Administered 2021-01-16 (×3): 50 ug via INTRAVENOUS

## 2021-01-16 MED ORDER — SODIUM CHLORIDE 0.9 % IR SOLN
Status: DC | PRN
  Administered 2021-01-16 (×3): 1000 mL

## 2021-01-16 MED ORDER — FENTANYL CITRATE (PF) 100 MCG/2ML IJ SOLN
INTRAMUSCULAR | Status: AC
  Filled 2021-01-16: qty 2

## 2021-01-16 MED ORDER — HYDROMORPHONE HCL 2 MG/ML IJ SOLN
INTRAMUSCULAR | Status: AC
  Filled 2021-01-16: qty 1

## 2021-01-16 MED ORDER — STERILE WATER FOR IRRIGATION IR SOLN
Status: DC | PRN
  Administered 2021-01-16: 500 mL

## 2021-01-16 MED ORDER — PROPOFOL 10 MG/ML IV BOLUS
INTRAVENOUS | Status: DC | PRN
Start: 2021-01-16 — End: 2021-01-16
  Administered 2021-01-16: 170 mg via INTRAVENOUS

## 2021-01-16 MED ORDER — ONDANSETRON HCL 4 MG/2ML IJ SOLN
4.0000 mg | Freq: Four times a day (QID) | INTRAMUSCULAR | Status: DC | PRN
  Administered 2021-01-16: 4 mg via INTRAVENOUS

## 2021-01-16 MED ORDER — MIDAZOLAM HCL 2 MG/2ML IJ SOLN
INTRAMUSCULAR | Status: AC
  Filled 2021-01-16: qty 2

## 2021-01-16 MED ORDER — IBUPROFEN 800 MG PO TABS
800.0000 mg | ORAL_TABLET | Freq: Three times a day (TID) | ORAL | Status: DC
  Administered 2021-01-16 – 2021-01-17 (×2): 800 mg via ORAL

## 2021-01-16 MED ORDER — GABAPENTIN 100 MG PO CAPS
100.0000 mg | ORAL_CAPSULE | Freq: Once | ORAL | Status: AC
  Administered 2021-01-16: 100 mg via ORAL

## 2021-01-16 MED ORDER — ZOLPIDEM TARTRATE 5 MG PO TABS: 5.0000 mg | ORAL_TABLET | Freq: Every evening | ORAL | Status: DC | PRN

## 2021-01-16 MED ORDER — ACETAMINOPHEN 500 MG PO TABS
ORAL_TABLET | ORAL | Status: AC
  Filled 2021-01-16: qty 2

## 2021-01-16 MED ORDER — POVIDONE-IODINE 10 % EX SWAB: 2.0000 "application " | Freq: Once | CUTANEOUS | Status: DC

## 2021-01-16 MED ORDER — IBUPROFEN 800 MG PO TABS
ORAL_TABLET | ORAL | Status: AC
  Filled 2021-01-16: qty 1

## 2021-01-16 MED ORDER — LACTATED RINGERS IV SOLN: INTRAVENOUS | Status: DC

## 2021-01-16 MED ORDER — FENTANYL CITRATE (PF) 100 MCG/2ML IJ SOLN: 25.0000 ug | INTRAMUSCULAR | Status: DC | PRN

## 2021-01-16 MED ORDER — OXYCODONE HCL 5 MG PO TABS
5.0000 mg | ORAL_TABLET | Freq: Once | ORAL | Status: AC | PRN
  Administered 2021-01-16: 5 mg via ORAL

## 2021-01-16 MED ORDER — ONDANSETRON HCL 4 MG PO TABS: 4.0000 mg | ORAL_TABLET | Freq: Four times a day (QID) | ORAL | Status: DC | PRN

## 2021-01-16 MED ORDER — PROPOFOL 10 MG/ML IV BOLUS
INTRAVENOUS | Status: AC
  Filled 2021-01-16: qty 20

## 2021-01-16 MED ORDER — OXYCODONE HCL 5 MG PO TABS
ORAL_TABLET | ORAL | Status: AC
  Filled 2021-01-16: qty 1

## 2021-01-16 MED ORDER — KETOROLAC TROMETHAMINE 30 MG/ML IJ SOLN
INTRAMUSCULAR | Status: DC | PRN
  Administered 2021-01-16: 30 mg via INTRAVENOUS

## 2021-01-16 MED ORDER — KETOROLAC TROMETHAMINE 30 MG/ML IJ SOLN: 30.0000 mg | Freq: Once | INTRAMUSCULAR | Status: DC

## 2021-01-16 MED ORDER — SUGAMMADEX SODIUM 200 MG/2ML IV SOLN
INTRAVENOUS | Status: DC | PRN
  Administered 2021-01-16: 200 mg via INTRAVENOUS

## 2021-01-16 MED ORDER — DEXMEDETOMIDINE (PRECEDEX) IN NS 20 MCG/5ML (4 MCG/ML) IV SYRINGE
PREFILLED_SYRINGE | INTRAVENOUS | Status: AC
  Filled 2021-01-16: qty 5

## 2021-01-16 MED ORDER — SENNA 8.6 MG PO TABS
ORAL_TABLET | ORAL | Status: AC
  Filled 2021-01-16: qty 1

## 2021-01-16 MED ORDER — PANTOPRAZOLE SODIUM 40 MG PO TBEC
DELAYED_RELEASE_TABLET | ORAL | Status: AC
  Filled 2021-01-16: qty 1

## 2021-01-16 MED ORDER — WHITE PETROLATUM EX OINT
TOPICAL_OINTMENT | CUTANEOUS | Status: AC
  Filled 2021-01-16: qty 5

## 2021-01-16 MED ORDER — SENNA 8.6 MG PO TABS
1.0000 | ORAL_TABLET | Freq: Two times a day (BID) | ORAL | Status: DC
  Administered 2021-01-16 (×2): 8.6 mg via ORAL

## 2021-01-16 MED ORDER — IBUPROFEN 800 MG PO TABS: 800.0000 mg | ORAL_TABLET | Freq: Three times a day (TID) | ORAL | 1 refills | Status: DC | PRN

## 2021-01-16 MED ORDER — OXYCODONE HCL 5 MG PO TABS: 5.0000 mg | ORAL_TABLET | ORAL | 0 refills | Status: DC | PRN

## 2021-01-16 MED ORDER — BISACODYL 5 MG PO TBEC: 5.0000 mg | DELAYED_RELEASE_TABLET | Freq: Every day | ORAL | Status: DC | PRN

## 2021-01-16 MED ORDER — ROCURONIUM BROMIDE 10 MG/ML (PF) SYRINGE
PREFILLED_SYRINGE | INTRAVENOUS | Status: AC
  Filled 2021-01-16: qty 10

## 2021-01-16 MED ORDER — FENTANYL CITRATE (PF) 250 MCG/5ML IJ SOLN
INTRAMUSCULAR | Status: AC
  Filled 2021-01-16: qty 5

## 2021-01-16 MED ORDER — METHYLENE BLUE 0.5 % INJ SOLN
INTRAVENOUS | Status: AC
  Filled 2021-01-16: qty 10

## 2021-01-16 MED ORDER — ACETAMINOPHEN 500 MG PO TABS
1000.0000 mg | ORAL_TABLET | ORAL | Status: AC
  Administered 2021-01-16: 1000 mg via ORAL

## 2021-01-16 MED ORDER — LIDOCAINE 2% (20 MG/ML) 5 ML SYRINGE
INTRAMUSCULAR | Status: DC | PRN
  Administered 2021-01-16: 100 mg via INTRAVENOUS

## 2021-01-16 MED ORDER — DEXAMETHASONE SODIUM PHOSPHATE 10 MG/ML IJ SOLN
INTRAMUSCULAR | Status: AC
  Filled 2021-01-16: qty 1

## 2021-01-16 MED ORDER — DEXMEDETOMIDINE (PRECEDEX) IN NS 20 MCG/5ML (4 MCG/ML) IV SYRINGE
PREFILLED_SYRINGE | INTRAVENOUS | Status: DC | PRN
  Administered 2021-01-16: 12 ug via INTRAVENOUS

## 2021-01-16 MED ORDER — HEMOSTATIC AGENTS (NO CHARGE) OPTIME
TOPICAL | Status: DC | PRN
  Administered 2021-01-16: 1

## 2021-01-16 MED ORDER — SCOPOLAMINE 1 MG/3DAYS TD PT72
1.0000 | MEDICATED_PATCH | TRANSDERMAL | Status: DC
  Administered 2021-01-16: 1.5 mg via TRANSDERMAL

## 2021-01-16 MED ORDER — OXYCODONE HCL 5 MG/5ML PO SOLN: 5.0000 mg | Freq: Once | ORAL | Status: AC | PRN

## 2021-01-16 MED ORDER — LACTATED RINGERS IV SOLN
INTRAVENOUS | Status: DC
  Administered 2021-01-16: 1 mL via INTRAVENOUS

## 2021-01-16 MED ORDER — ACETAMINOPHEN 500 MG PO TABS: 1000.0000 mg | ORAL_TABLET | Freq: Three times a day (TID) | ORAL | 0 refills | Status: DC | PRN

## 2021-01-16 MED ORDER — AMISULPRIDE (ANTIEMETIC) 5 MG/2ML IV SOLN: 10.0000 mg | Freq: Once | INTRAVENOUS | Status: DC | PRN

## 2021-01-16 MED ORDER — SODIUM CHLORIDE (PF) 0.9 % IJ SOLN
INTRAMUSCULAR | Status: AC
  Filled 2021-01-16: qty 10

## 2021-01-16 MED ORDER — SODIUM CHLORIDE 0.9 % IV SOLN
INTRAVENOUS | Status: DC | PRN
  Administered 2021-01-16: 120 mL

## 2021-01-16 MED ORDER — OXYCODONE HCL 5 MG PO TABS
5.0000 mg | ORAL_TABLET | ORAL | Status: DC | PRN
  Administered 2021-01-16 – 2021-01-17 (×2): 5 mg via ORAL
  Administered 2021-01-17: 10 mg via ORAL

## 2021-01-16 SURGICAL SUPPLY — 73 items
ADH SKN CLS APL DERMABOND .7 (GAUZE/BANDAGES/DRESSINGS) ×2
APL SRG 38 LTWT LNG FL B (MISCELLANEOUS) ×2
APPLICATOR ARISTA FLEXITIP XL (MISCELLANEOUS) ×1 IMPLANT
BAG DECANTER FOR FLEXI CONT (MISCELLANEOUS) ×1 IMPLANT
CATH FOLEY 3WAY  5CC 16FR (CATHETERS) ×3
CATH FOLEY 3WAY 5CC 16FR (CATHETERS) ×2 IMPLANT
CELLS DAT CNTRL 66122 CELL SVR (MISCELLANEOUS) IMPLANT
COVER BACK TABLE 60X90IN (DRAPES) ×3 IMPLANT
COVER TIP SHEARS 8 DVNC (MISCELLANEOUS) ×2 IMPLANT
COVER TIP SHEARS 8MM DA VINCI (MISCELLANEOUS) ×3
DECANTER SPIKE VIAL GLASS SM (MISCELLANEOUS) ×6 IMPLANT
DEFOGGER SCOPE WARMER CLEARIFY (MISCELLANEOUS) ×3 IMPLANT
DERMABOND ADVANCED (GAUZE/BANDAGES/DRESSINGS) ×1
DERMABOND ADVANCED .7 DNX12 (GAUZE/BANDAGES/DRESSINGS) ×2 IMPLANT
DILATOR CANAL MILEX (MISCELLANEOUS) ×3 IMPLANT
DRAPE ARM DVNC X/XI (DISPOSABLE) ×8 IMPLANT
DRAPE COLUMN DVNC XI (DISPOSABLE) ×2 IMPLANT
DRAPE DA VINCI XI ARM (DISPOSABLE) ×12
DRAPE DA VINCI XI COLUMN (DISPOSABLE) ×3
DRAPE UTILITY 15X26 TOWEL STRL (DRAPES) ×3 IMPLANT
DURAPREP 26ML APPLICATOR (WOUND CARE) ×3 IMPLANT
ELECT REM PT RETURN 9FT ADLT (ELECTROSURGICAL) ×3
ELECTRODE REM PT RTRN 9FT ADLT (ELECTROSURGICAL) ×2 IMPLANT
GAUZE 4X4 16PLY ~~LOC~~+RFID DBL (SPONGE) ×6 IMPLANT
GLOVE SURG ENC TEXT LTX SZ6.5 (GLOVE) ×9 IMPLANT
GLOVE SURG POLYISO LF SZ7 (GLOVE) ×2 IMPLANT
GLOVE SURG POLYISO LF SZ7.5 (GLOVE) ×2 IMPLANT
GLOVE SURG UNDER POLY LF SZ6.5 (GLOVE) ×18 IMPLANT
GLOVE SURG UNDER POLY LF SZ7 (GLOVE) ×3 IMPLANT
HEMOSTAT ARISTA ABSORB 3G PWDR (HEMOSTASIS) ×1 IMPLANT
HOLDER FOLEY CATH W/STRAP (MISCELLANEOUS) ×1 IMPLANT
IRRIG SUCT STRYKERFLOW 2 WTIP (MISCELLANEOUS) ×3
IRRIGATION SUCT STRKRFLW 2 WTP (MISCELLANEOUS) ×2 IMPLANT
IV NS 1000ML (IV SOLUTION) ×9
IV NS 1000ML BAXH (IV SOLUTION) IMPLANT
KIT TURNOVER CYSTO (KITS) ×3 IMPLANT
LEGGING LITHOTOMY PAIR STRL (DRAPES) ×3 IMPLANT
MANIFOLD NEPTUNE II (INSTRUMENTS) ×1 IMPLANT
NDL SAFETY ECLIPSE 18X1.5 (NEEDLE) IMPLANT
NEEDLE HYPO 18GX1.5 SHARP (NEEDLE) ×3
OBTURATOR OPTICAL STANDARD 8MM (TROCAR)
OBTURATOR OPTICAL STND 8 DVNC (TROCAR)
OBTURATOR OPTICALSTD 8 DVNC (TROCAR) IMPLANT
OCCLUDER COLPOPNEUMO (BALLOONS) ×3 IMPLANT
PACK ROBOT WH (CUSTOM PROCEDURE TRAY) ×3 IMPLANT
PACK ROBOTIC GOWN (GOWN DISPOSABLE) ×3 IMPLANT
PACK TRENDGUARD 450 HYBRID PRO (MISCELLANEOUS) IMPLANT
PAD OB MATERNITY 4.3X12.25 (PERSONAL CARE ITEMS) ×3 IMPLANT
PAD PREP 24X48 CUFFED NSTRL (MISCELLANEOUS) ×3 IMPLANT
PROTECTOR NERVE ULNAR (MISCELLANEOUS) ×3 IMPLANT
RETRACTOR WND ALEXIS 18 MED (MISCELLANEOUS) IMPLANT
RTRCTR WOUND ALEXIS 18CM MED (MISCELLANEOUS)
RTRCTR WOUND ALEXIS 18CM SML (INSTRUMENTS)
SAVER CELL AAL HAEMONETICS (INSTRUMENTS) IMPLANT
SCISSORS LAP 5X45 EPIX DISP (ENDOMECHANICALS) ×1 IMPLANT
SEAL CANN UNIV 5-8 DVNC XI (MISCELLANEOUS) ×8 IMPLANT
SEAL XI 5MM-8MM UNIVERSAL (MISCELLANEOUS) ×12
SEALER VESSEL DA VINCI XI (MISCELLANEOUS) ×3
SEALER VESSEL EXT DVNC XI (MISCELLANEOUS) IMPLANT
SET IRRIG Y TYPE TUR BLADDER L (SET/KITS/TRAYS/PACK) ×1 IMPLANT
SET TRI-LUMEN FLTR TB AIRSEAL (TUBING) ×1 IMPLANT
SUT VIC AB 0 CT1 27 (SUTURE) ×6
SUT VIC AB 0 CT1 27XBRD ANBCTR (SUTURE) ×4 IMPLANT
SUT VICRYL 0 UR6 27IN ABS (SUTURE) IMPLANT
SUT VICRYL RAPIDE 4/0 PS 2 (SUTURE) ×8 IMPLANT
SUT VLOC 180 0 9IN  GS21 (SUTURE) ×3
SUT VLOC 180 0 9IN GS21 (SUTURE) ×2 IMPLANT
SYR 10ML LL (SYRINGE) ×1 IMPLANT
TIP UTERINE 6.7X8CM BLUE DISP (MISCELLANEOUS) ×1 IMPLANT
TOWEL OR 17X26 10 PK STRL BLUE (TOWEL DISPOSABLE) ×3 IMPLANT
TRENDGUARD 450 HYBRID PRO PACK (MISCELLANEOUS) ×3
TROCAR PORT AIRSEAL 8X120 (TROCAR) ×3 IMPLANT
WATER STERILE IRR 500ML POUR (IV SOLUTION) ×1 IMPLANT

## 2021-01-16 NOTE — Anesthesia Procedure Notes (Signed)
Procedure Name: Intubation Date/Time: 01/16/2021 8:36 AM Performed by: Rogers Blocker, CRNA Pre-anesthesia Checklist: Patient identified, Emergency Drugs available, Suction available and Patient being monitored Patient Re-evaluated:Patient Re-evaluated prior to induction Oxygen Delivery Method: Circle System Utilized Preoxygenation: Pre-oxygenation with 100% oxygen Induction Type: IV induction Ventilation: Mask ventilation without difficulty Laryngoscope Size: Mac and 3 Grade View: Grade I Tube type: Oral Number of attempts: 1 Airway Equipment and Method: Stylet and Bite block Placement Confirmation: ETT inserted through vocal cords under direct vision, positive ETCO2 and breath sounds checked- equal and bilateral Secured at: 21 cm Tube secured with: Tape Dental Injury: Teeth and Oropharynx as per pre-operative assessment

## 2021-01-16 NOTE — Anesthesia Postprocedure Evaluation (Signed)
Anesthesia Post Note  Patient: Desiree Martinez  Procedure(s) Performed: XI ROBOTIC ASSISTED LAPAROSCOPIC HYSTERECTOMY AND BILATERAL SALPINGECTOMY AND LEFT OOPHORECTOMY. (Abdomen) CYSTOSCOPY (Urethra)     Patient location during evaluation: PACU Anesthesia Type: General Level of consciousness: awake Pain management: pain level controlled Vital Signs Assessment: post-procedure vital signs reviewed and stable Respiratory status: spontaneous breathing and respiratory function stable Cardiovascular status: stable Postop Assessment: no apparent nausea or vomiting Anesthetic complications: no   No notable events documented.  Last Vitals:  Vitals:   01/16/21 1200 01/16/21 1215  BP: 131/80 131/83  Pulse: 99 94  Resp: 13 (!) 0  Temp:    SpO2: 97% 99%    Last Pain:  Vitals:   01/16/21 1200  TempSrc:   PainSc: 0-No pain                 Merlinda Frederick

## 2021-01-16 NOTE — Op Note (Signed)
01/16/2021  11:20 AM  PATIENT:  Desiree Martinez  31 y.o. female  PRE-OPERATIVE DIAGNOSIS:  Fibroids Complex Cyst of Left Ovary Pelvic Pain  POST-OPERATIVE DIAGNOSIS:  Fibroids Complex Cyst of Left Ovary Pelvic Pain  PROCEDURE:  Procedure(s) with comments: XI ROBOTIC ASSISTED LAPAROSCOPIC HYSTERECTOMY AND BILATERAL SALPINGECTOMY AND LEFT OOPHORECTOMY. (N/A) - abdominal and vaginal sites CYSTOSCOPY  SURGEON:  Surgeon(s) and Role:    Christophe Louis, MD - Primary  PHYSICIAN ASSISTANT: none  ASSISTANTS: Gaylord Shih RNFA assisted due to complexity of the anatomy    ANESTHESIA:   general  EBL:  50 mL   BLOOD ADMINISTERED:none  DRAINS: Urinary Catheter (Foley)   LOCAL MEDICATIONS USED:  OTHER ropivicaine  SPECIMEN:  Source of Specimen:  uterus cervix bilateral fallopian tube left ovary and left adnexal mass   DISPOSITION OF SPECIMEN:  PATHOLOGY  COUNTS:  YES  TOURNIQUET:  * No tourniquets in log *  DICTATION: .Note written in EPIC  PLAN OF CARE: Admit for overnight observation  PATIENT DISPOSITION:  PACU - hemodynamically stable.   Delay start of Pharmacological VTE agent (>24hrs) due to surgical blood loss or risk of bleeding: not applicable  Findings: Normal external genitalia vaginal mucosa and cervix. Adhesions of the colon to the Left adnexa. Left ovarian endometrioma. Stage 3 endometriosis . Adhesions of the left adnexa to the left abdominal side wall.   Procedure: The patient was taken to the operating room where she was placed under general anesthesia.Time out was performed. Marland Kitchen She was placed in dorsal lithotomy position and prepped and draped in the usual sterile fashion. A weighted speculum was placed into the vagina. A Deaver was placed anteriorly for retraction. The anterior lip of the cervix was grasped with a single-tooth tenaculum. The vaginal mucosa was injected with 2.5 cc of ropivacaine at the 2/4/ 8 and 10 o'clock positions. The uterus was sounded to 8  cm. the cervix was dilated to 6 mm . 0 vicryl suture placed at the 12 and 6:00 positions Of the cervix to facilitate placement of a Ru mi uterine manipulator. The manipulator was placed without difficulty. Weighted speculum and Deaver were removed .  Attention was turned to the patient's abdomen where a 8 mm trocar was placed 2 cm above the umbilicus. under direct visualization . The pneumoperitoneum was achieved with PCO2 gas. The laparoscope was removed. 60 cc of ropivacaine were injected into the abdominal cavity. The laparoscope was reinserted. An 8 mm trocar was placed in the right upper quadrant 16 centimeters from the umbilicus.later connected to robotic arm #4). An 8MM incision was made in the Right upper quadrant TROCAR WAS PLACED 8 cm from the umbilicus. Later connected to robotic arm #3. An 8 mm incision was made in the left upper quadrant 16 cm from the umbilicus and connected to robot arm #1. Marland Kitchen Attention was turned to the left upper quadrant where a 8 mm midclavicular assistant trocar was placed. ( All incision sites were injected with 10cc of ropivacaine prior to port placement. )  Once all ports had been placed under direct visualization.The laparoscope was removed and the [employer] robotic system was then right-sided docked. The robotic arms were connected to the corresponding trocars as listed above. The laparoscope was then reinserted. The long tip bipolar forceps were placed into port #1. The  pro-grasp placed in the port #4. A vessel sealer  (alternating with monopolar scissors )was placed in port #3. All instruments were directed into the pelvis under direct  visualization.  Attention was turned to the surgeons console..  The colon was dissected off the left adnexa with a combination of sharp and blunt dissection. The infudibulo-pelvic  ligament was cauterized and transected with the vessel sealer The broad ligament was cauterized and transected with the vessel sealer .The round ligament was  cauterized and transected with the vessel sealer  The anterior leaf of broad ligament was incised along the bladder reflection to the midline.  The right  mesosalpinx and right utero-ovarian ligament was cauterized and transected with the vessel sealer. The right broad ligament was cauterized and transected with the vessel sealer. The right round ligament was cauterized and transected with the vessel sealer The broad ligament was incised to the midline. The bladder was dissected off the lower uterine segments of the cervix via sharp and blunt dissection.   The uterine arteries were skeleton bilaterally. They were  cauterized and transected with the vessel sealer The KOH ring was identified. The anterior colpotomy was performed followed by the posterior colpotomy. Once the uterus,cervix and bilateral fallopian tubes were completely excised was removed through the vagina. The  bipolar forceps and scissors were removed and cobra forceps were placed in the port #1 and the Mega needle driver was placed in to port #3.  The vaginal cuff was closed with running suture if 0 v-lock. The pelvis was irrigated. Marland KitchenMarland KitchenMarland KitchenExcellent hemostasis was noted. Arista was placed along the vaginal cuff.  All pelvic pedicles were examined and hemostasis was noted.  All instruments removed from the ports. All ports were removed under direct Visualization. The pneumoperitoneum was released. The skin incisions were closed with 4-0 Vicryl and then covered with Derma bond.     Sponge lap and needle counts weIre correct x. The patient was awakened from anesthesia and taken to the recovery room in stable condition.

## 2021-01-16 NOTE — H&P (Signed)
Date of Initial H&P: 01/14/2021  History reviewed, patient examined, no change in status, stable for surgery.

## 2021-01-16 NOTE — Transfer of Care (Signed)
Immediate Anesthesia Transfer of Care Note  Patient: Desiree Martinez  Procedure(s) Performed: XI ROBOTIC ASSISTED LAPAROSCOPIC HYSTERECTOMY AND BILATERAL SALPINGECTOMY AND LEFT OOPHORECTOMY. (Abdomen) CYSTOSCOPY (Urethra)  Patient Location: PACU  Anesthesia Type:General  Level of Consciousness: drowsy and patient cooperative  Airway & Oxygen Therapy: Patient Spontanous Breathing and Patient connected to face mask oxygen  Post-op Assessment: Report given to RN and Post -op Vital signs reviewed and stable  Post vital signs: Reviewed and stable  Last Vitals:  Vitals Value Taken Time  BP    Temp    Pulse    Resp 19 01/16/21 1127  SpO2    Vitals shown include unvalidated device data.  Last Pain:  Vitals:   01/16/21 0706  TempSrc: Oral  PainSc: 3       Patients Stated Pain Goal: 4 (50/15/86 8257)  Complications: No notable events documented.

## 2021-01-17 ENCOUNTER — Encounter (HOSPITAL_BASED_OUTPATIENT_CLINIC_OR_DEPARTMENT_OTHER): Payer: Self-pay | Admitting: Obstetrics and Gynecology

## 2021-01-17 DIAGNOSIS — D259 Leiomyoma of uterus, unspecified: Secondary | ICD-10-CM | POA: Diagnosis not present

## 2021-01-17 LAB — CBC
HCT: 31.7 % — ABNORMAL LOW (ref 36.0–46.0)
Hemoglobin: 9.9 g/dL — ABNORMAL LOW (ref 12.0–15.0)
MCH: 28 pg (ref 26.0–34.0)
MCHC: 31.2 g/dL (ref 30.0–36.0)
MCV: 89.5 fL (ref 80.0–100.0)
Platelets: 239 10*3/uL (ref 150–400)
RBC: 3.54 MIL/uL — ABNORMAL LOW (ref 3.87–5.11)
RDW: 16.6 % — ABNORMAL HIGH (ref 11.5–15.5)
WBC: 13.3 10*3/uL — ABNORMAL HIGH (ref 4.0–10.5)
nRBC: 0 % (ref 0.0–0.2)

## 2021-01-17 LAB — SURGICAL PATHOLOGY

## 2021-01-17 MED ORDER — ACETAMINOPHEN 500 MG PO TABS
ORAL_TABLET | ORAL | Status: AC
  Filled 2021-01-17: qty 2

## 2021-01-17 MED ORDER — IBUPROFEN 800 MG PO TABS
ORAL_TABLET | ORAL | Status: AC
  Filled 2021-01-17: qty 1

## 2021-01-17 MED ORDER — SIMETHICONE 80 MG PO CHEW
CHEWABLE_TABLET | ORAL | Status: AC
  Filled 2021-01-17: qty 1

## 2021-01-17 MED ORDER — OXYCODONE HCL 5 MG PO TABS
ORAL_TABLET | ORAL | Status: AC
  Filled 2021-01-17: qty 2

## 2021-01-23 NOTE — Discharge Summary (Signed)
Physician Discharge Summary  Patient ID: Desiree Martinez MRN: [id number] DOB/AGE: [birth year] 31 y.o.  Admit date: 01/16/2021 Discharge date: 01/17/2021  Admission Diagnoses: Abnormal Uterine bleeding   Discharge Diagnoses:  Principal Problem:   Abnormal uterine bleeding   Discharged Condition: stable  Hospital Course: Pt was admitted for observation after undergoing a robotic assisted laparoscopic hysterectomy with bilateral salpingectomy and left oophorectomy. She did well postoperatively with return of bladder function. Tolerating po and ambulating without difficutly. Her pain is well controlled   Consults: None  Significant Diagnostic Studies: labs: hgb on pod #1 9.9   Treatments: surgery: robotic assisted laparoscopic hysterectomy with bilateral salpingectomy and left oophorectomy   Discharge Exam: Blood pressure 124/81, pulse 78, temperature 98 F (36.7 C), resp. rate 18, height 5\' 5"  (1.651 m), weight 99.7 kg, last menstrual period 12/06/2020, SpO2 99 %. General appearance: alert, cooperative, and no distress Resp: no respiratory distress  GI: soft appropriately tender nondistended  Extremities: extremities normal, atraumatic, no cyanosis or edema Incision/Wound: well approximated no erythema or exudate   Disposition: Discharge disposition: 01-Home or Self Care       Discharge Instructions     Call MD for:  persistant nausea and vomiting   Complete by: As directed    Call MD for:  redness, tenderness, or signs of infection (pain, swelling, redness, odor or green/yellow discharge around incision site)   Complete by: As directed    Call MD for:  severe uncontrolled pain   Complete by: As directed    Call MD for:  temperature >100.4   Complete by: As directed    Diet general   Complete by: As directed    Driving Restrictions   Complete by: As directed    Avoid driving for 1 week   Increase activity slowly   Complete by: As directed    Lifting restrictions    Complete by: As directed    Avoid lifting over 10 lbs   May shower / Bathe   Complete by: As directed    May walk up steps   Complete by: As directed    No dressing needed   Complete by: As directed    Sexual Activity Restrictions   Complete by: As directed    Avoid sex for 6 weeks and until approved by Dr. Landry Mellow      Allergies as of 01/17/2021       Reactions   Peanuts [peanut Oil] Hives, Shortness Of Breath, Swelling   Throat swells        Medication List     TAKE these medications    acetaminophen 500 MG tablet Commonly known as: TYLENOL Take 2 tablets (1,000 mg total) by mouth every 8 (eight) hours as needed.   ibuprofen 800 MG tablet Commonly known as: ADVIL Take 1 tablet (800 mg total) by mouth every 8 (eight) hours as needed.   oxyCODONE 5 MG immediate release tablet Commonly known as: Oxy IR/ROXICODONE Take 1-2 tablets (5-10 mg total) by mouth every 4 (four) hours as needed for moderate pain.               Discharge Care Instructions  (From admission, onward)           Start     Ordered   01/16/21 0000  No dressing needed        01/16/21 1520            Follow-up Information     Christophe Louis, MD. Go in 2  week(s).   Specialty: Obstetrics and Gynecology Contact information: [address] [place] [address] [phone]                 Signed: Christophe Louis 01/23/2021, 11:06 AM

## 2022-01-02 ENCOUNTER — Encounter (HOSPITAL_COMMUNITY): Payer: Self-pay

## 2022-01-02 ENCOUNTER — Emergency Department (HOSPITAL_COMMUNITY)
Admission: EM | Admit: 2022-01-02 | Discharge: 2022-01-02 | Discharge status: Against Medical Advice | Payer: 59 | Attending: Emergency Medicine | Admitting: Emergency Medicine

## 2022-01-02 ENCOUNTER — Other Ambulatory Visit: Payer: Self-pay

## 2022-01-02 DIAGNOSIS — L02412 Cutaneous abscess of left axilla: Secondary | ICD-10-CM | POA: Insufficient documentation

## 2022-01-02 DIAGNOSIS — Z5321 Procedure and treatment not carried out due to patient leaving prior to being seen by health care provider: Secondary | ICD-10-CM | POA: Diagnosis not present

## 2022-01-02 NOTE — ED Triage Notes (Signed)
Patient c/o abscess to the left axillary 6 days ago. Patient has a history of HS.

## 2022-01-02 NOTE — ED Provider Triage Note (Signed)
Emergency Medicine Provider Triage Evaluation Note  Desiree Martinez , a 31 y.o. female  was evaluated in triage.  Pt complains of abscess to the left armpit.  Patient has a history of hidradenitis suppurativa.  This has been forming for the last week.  She has been trying ibuprofen and warm soaks with little improvement.  Not currently draining.  No fever or chills.  Review of Systems  Positive:  Negative: See above   Physical Exam  BP (!) 130/98 (BP Location: Right Arm)   Pulse 97   Temp 99.3 F (37.4 C) (Oral)   Resp 14   Ht '5\' 5"'$  (1.651 m)   Wt 99.8 kg   LMP 12/06/2020 (Approximate)   SpO2 99%   BMI 36.61 kg/m  Gen:   Awake, no distress   Resp:  Normal effort  MSK:   Moves extremities without difficulty  Other:  3 large areas of fluctuance with the largest measuring 1.5cm in diameter.  Medical Decision Making  Medically screening exam initiated at 3:37 PM.  Appropriate orders placed.  Richell S Troupe was informed that the remainder of the evaluation will be completed by another provider, this initial triage assessment does not replace that evaluation, and the importance of remaining in the ED until their evaluation is complete.  Visualized areas under ultrasound guidance.  Largest measures 1.5 centimeters.  There is surrounding erythema.  Not actively draining.   Myna Bright [place], [place] 01/02/22 1538

## 2022-03-14 DIAGNOSIS — H9311 Tinnitus, right ear: Secondary | ICD-10-CM | POA: Insufficient documentation

## 2022-03-14 HISTORY — DX: Tinnitus, right ear: H93.11

## 2022-08-18 ENCOUNTER — Ambulatory Visit (HOSPITAL_BASED_OUTPATIENT_CLINIC_OR_DEPARTMENT_OTHER): Payer: 59 | Admitting: Family Medicine

## 2022-09-09 ENCOUNTER — Ambulatory Visit (INDEPENDENT_AMBULATORY_CARE_PROVIDER_SITE_OTHER): Payer: 59 | Admitting: Family Medicine

## 2022-09-09 VITALS — BP 129/85 | HR 85 | Ht 65.5 in | Wt 227.8 lb

## 2022-09-09 DIAGNOSIS — N63 Unspecified lump in unspecified breast: Secondary | ICD-10-CM | POA: Diagnosis not present

## 2022-09-09 DIAGNOSIS — L732 Hidradenitis suppurativa: Secondary | ICD-10-CM

## 2022-09-09 MED ORDER — CLINDAMYCIN PHOSPHATE 1 % EX GEL: CUTANEOUS | 3 refills | Status: AC

## 2022-09-09 MED ORDER — DOXYCYCLINE HYCLATE 100 MG PO TABS: 100.0000 mg | ORAL_TABLET | Freq: Two times a day (BID) | ORAL | 0 refills | Status: AC

## 2022-09-09 NOTE — Patient Instructions (Addendum)
If you do not hear from [facility] to schedule within 1-2 days, please call our office. [phone]   Hibiclens (or generic) downstairs in pharmacy for approx. $5- can use 1-2x a month   Benzoyl peroxide creamy wash- over the counter approx. $5-10 - can use 1-2x a week to face and body.   Exfoliating well can assist in reducing flare ups.

## 2022-09-09 NOTE — Progress Notes (Signed)
New Patient Office Visit  Subjective:   Desiree Martinez February 23, 1990 09/09/2022  Chief Complaint  Patient presents with   New Patient (Initial Visit)    Pt is here to get established with the practice. Has a bump on her right breast that she wants looked at.    HPI: Desiree Martinez presents today to establish care at [facility] at [facility]. Introduced to Publishing rights manager role and practice setting.  All questions answered.   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 She states she noticed a new onset of abscess present to the right breast and under both bilateral breast.  These areas are sometimes draining, denies bleeding or erythema or pain.  This area has been present for about 1 month. Reports intermittent drainage. Denies OTC topical treatment. She states area to the right breast has gotten larger in the past few weeks.   Family Breast Cancer History:  Maternal Great Aunt - Breast Cancer  Paternal Grandmother- Ovarian Cancer possibly.    The following portions of the patient's history were reviewed and updated as appropriate: past medical history, past surgical history, family history, social history, allergies, medications, and problem list.   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   Wears glasses    Past Surgical History:  Procedure Laterality Date   ABDOMINAL HYSTERECTOMY  01/2020   Partial (left) hysterectomy   CYSTOSCOPY  01/16/2021   Procedure: CYSTOSCOPY;  Surgeon: Gerald Leitz, MD;  Location[facility];  Service: Gynecology;;   LAPAROSCOPIC OVARIAN CYSTECTOMY Left 07/02/2016   Procedure: LAPAROSCOPIC OVARIAN CYSTECTOMY, Lysis of Adhesions;  Surgeon: Geryl Rankins, MD;  Location: [facility];  Service: Gynecology;  Laterality: Left;   right foot surgery Right 06/2013   bunionectomy   ROBOTIC ASSISTED LAPAROSCOPIC HYSTERECTOMY AND SALPINGECTOMY N/A 01/16/2021   Procedure: XI ROBOTIC ASSISTED LAPAROSCOPIC HYSTERECTOMY AND BILATERAL SALPINGECTOMY AND LEFT OOPHORECTOMY.;  Surgeon: Gerald Leitz, MD;  Location: [facility];  Service: Gynecology;  Laterality: N/A;  abdominal and vaginal sites   Family History  Problem Relation Age of Onset   Ovarian cancer Paternal Grandmother    Breast cancer Other    Social History   Socioeconomic History   Marital status: Single    Spouse name: Not on file   Number of children: Not on file   Years of education: Not on file   Highest education level: Master's degree (e.g., MA, MS, MEng, MEd, MSW, MBA)  Occupational History   Not on file  Tobacco Use   Smoking status: Former    Current packs/day: 0.00    Types: Cigars, Cigarettes    Quit  date: 01/07/2016    Years since quitting: 6.6   Smokeless tobacco: Never  Vaping Use   Vaping status: Never Used  Substance and Sexual Activity   Alcohol use: Yes    Comment: socially    Drug use: Yes    Types: Marijuana    Comment: Last use marijuana 2 weeks ago as of 01-02-2021   Sexual activity: Yes    Birth control/protection: None  Other Topics Concern   Not on file  Social History Narrative   Not on file   Social Determinants of Health   Financial Resource Strain:  Low Risk  (09/09/2022)   Overall Financial Resource Strain (CARDIA)    Difficulty of Paying Living Expenses: Not very hard  Food Insecurity: No Food Insecurity (09/09/2022)   Hunger Vital Sign    Worried About Running Out of Food in the Last Year: Never true    Ran Out of Food in the Last Year: Never true  Transportation Needs: No Transportation Needs (09/09/2022)   PRAPARE - Administrator, Civil Service (Medical): No    Lack of Transportation (Non-Medical): No  Physical Activity: Insufficiently Active (09/09/2022)   Exercise Vital Sign    Days of Exercise per Week: 2 days    Minutes of Exercise per Session: 30 min  Stress: No Stress Concern Present (09/09/2022)   [employer] of Occupational Health - Occupational Stress Questionnaire    Feeling of Stress : Not at all  Social Connections: Unknown (09/09/2022)   Social Connection and Isolation Panel [NHANES]    Frequency of Communication with Friends and Family: More than three times a week    Frequency of Social Gatherings with Friends and Family: Once a week    Attends Religious Services: Patient declined    Database administrator or Organizations: No    Attends Engineer, structural: Not on file    Marital Status: Married  Catering manager Violence: Not on file   Outpatient Medications Prior to Visit  Medication Sig Dispense Refill   acetaminophen (TYLENOL) 500 MG tablet Take 2 tablets (1,000 mg total) by mouth every 8 (eight) hours as needed. 30 tablet 0   ibuprofen (ADVIL) 800 MG tablet Take 1 tablet (800 mg total) by mouth every 8 (eight) hours as needed. 30 tablet 1   oxyCODONE (OXY IR/ROXICODONE) 5 MG immediate release tablet Take 1-2 tablets (5-10 mg total) by mouth every 4 (four) hours as needed for moderate pain. 20 tablet 0   No facility-administered medications prior to visit.   Allergies  Allergen Reactions   Peanuts [Peanut Oil] Hives, Shortness Of Breath and Swelling    Throat swells    ROS: A  complete ROS was performed with pertinent positives/negatives noted in the HPI. The remainder of the ROS are negative.   Objective:   Today's Vitals   09/09/22 1118  BP: 129/85  Pulse: 85  SpO2: 100%  Weight: 227 lb 12.8 oz (103.3 kg)  Height: 5' 5.5" (1.664 m)    GENERAL: Well-appearing, in NAD. Well nourished.  SKIN: Pink, warm and dry. No rash, lesion, ulceration, or ecchymoses.  Head: Normocephalic. NECK: Trachea midline. Full ROM w/o pain or tenderness.  RESPIRATORY: Chest wall symmetrical. Respirations even and non-labored.  BREASTS: Breasts pendulous, symmetrical, and with significant dense breast tissue bilaterally. Palpable nodule present to right upper outer quadrant near 1 o'clock with punctate center. Mild fluctuance, no drainage. Palpable nodule present to left lower quadrant of breast without visible opening or  abscess. Nipples everted and w/o discharge. No rash or skin retraction. No axillary or supraclavicular lymphadenopathy. Chaperoned by Cristy Hilts, CMA EXTREMITIES: Without clubbing, cyanosis, or edema.  NEUROLOGIC: No motor or sensory deficits. Steady, even gait. C2-C12 intact.  PSYCH/MENTAL STATUS: Alert, oriented x 3. Cooperative, appropriate mood and affect.   No results found for any visits on 09/09/22.     Assessment & Plan:  1. Hidradenitis suppurativa of multiple sites Start doxycycline 100mg  BID x 5 days for current abscess present to anterior right breast. Pt to sue Clindagel 1% BID PRN when HS abscesses occur. Recommend use of Hibiclens 1-2x a month and use of Benzoyl Peroxide creamy wash 1-2 x a week.   - doxycycline (VIBRA-TABS) 100 MG tablet; Take 1 tablet (100 mg total) by mouth 2 (two) times daily for 5 days.  Dispense: 10 tablet; Refill: 0 - clindamycin (CLINDAGEL) 1 % gel; Apply topically two times daily to affected lesions.  Dispense: 30 g; Refill: 3  2. Nodule of skin of breast Pt to obtain [facility]  given hx of HX and multiple abscess formation to rule out possible malignancy.   - US BREAST COMPLETE UNI RIGHT INC AXILLA; Future - US BREAST COMPLETE UNI LEFT INC AXILLA; Future   Patient to reach out to office if new, worrisome, or unresolved symptoms arise or if no improvement in patient's condition. Patient verbalized understanding and is agreeable to treatment plan. All questions answered to patient's satisfaction.    Return in about 4 weeks (around 10/07/2022) for ANNUAL PHYSICAL and PAP Smear .   Of note, portions of this note may have been created with voice recognition software Physicist, medical). While this note has been edited for accuracy, occasional wrong-word or 'sound-a-like' substitutions may have occurred due to the inherent limitations of voice recognition software.  Yolanda Manges, FNP

## 2022-09-10 ENCOUNTER — Other Ambulatory Visit (HOSPITAL_BASED_OUTPATIENT_CLINIC_OR_DEPARTMENT_OTHER): Payer: Self-pay | Admitting: Family Medicine

## 2022-09-10 DIAGNOSIS — N63 Unspecified lump in unspecified breast: Secondary | ICD-10-CM

## 2022-09-17 ENCOUNTER — Encounter (HOSPITAL_BASED_OUTPATIENT_CLINIC_OR_DEPARTMENT_OTHER): Payer: Self-pay | Admitting: Family Medicine

## 2022-09-24 ENCOUNTER — Ambulatory Visit
Admission: RE | Admit: 2022-09-24 | Discharge: 2022-09-24 | Disposition: A | Discharge status: Home / Self Care | Payer: 59 | Source: Ambulatory Visit | Attending: Family Medicine | Admitting: Family Medicine

## 2022-09-24 DIAGNOSIS — N63 Unspecified lump in unspecified breast: Secondary | ICD-10-CM

## 2022-09-25 NOTE — Progress Notes (Signed)
Hi Desiree Martinez Your mammogram and breast ultrasounds indicate benign (non-cancerous) lesions in both breasts. No further imaging is needed at this time. We will start routine mammograms at 32 years old. If you have questions, please contact the office.

## 2022-10-07 ENCOUNTER — Encounter (HOSPITAL_BASED_OUTPATIENT_CLINIC_OR_DEPARTMENT_OTHER): Payer: 59 | Admitting: Family Medicine

## 2022-11-04 ENCOUNTER — Encounter (HOSPITAL_BASED_OUTPATIENT_CLINIC_OR_DEPARTMENT_OTHER): Payer: 59 | Admitting: Family Medicine

## 2023-01-26 ENCOUNTER — Encounter (HOSPITAL_BASED_OUTPATIENT_CLINIC_OR_DEPARTMENT_OTHER): Payer: Self-pay | Admitting: *Deleted
